# Patient Record
Sex: Male | Born: 1965 | Race: White | Hispanic: No | Marital: Married | Smoking: Never smoker
Health system: Southern US, Community
[De-identification: ages and names within clinical notes are randomized; demographics above are authoritative.]

## PROBLEM LIST (undated history)

## (undated) DIAGNOSIS — N201 Calculus of ureter: Secondary | ICD-10-CM

## (undated) DIAGNOSIS — I10 Essential (primary) hypertension: Secondary | ICD-10-CM

## (undated) DIAGNOSIS — M1990 Unspecified osteoarthritis, unspecified site: Secondary | ICD-10-CM

## (undated) DIAGNOSIS — Z8249 Family history of ischemic heart disease and other diseases of the circulatory system: Secondary | ICD-10-CM

## (undated) DIAGNOSIS — Z8739 Personal history of other diseases of the musculoskeletal system and connective tissue: Secondary | ICD-10-CM

## (undated) DIAGNOSIS — Z955 Presence of coronary angioplasty implant and graft: Secondary | ICD-10-CM

## (undated) DIAGNOSIS — I2510 Atherosclerotic heart disease of native coronary artery without angina pectoris: Secondary | ICD-10-CM

## (undated) DIAGNOSIS — Z9189 Other specified personal risk factors, not elsewhere classified: Secondary | ICD-10-CM

## (undated) HISTORY — PX: CYSTECTOMY: SUR359

## (undated) HISTORY — DX: Essential (primary) hypertension: I10

## (undated) HISTORY — PX: CARDIOVASCULAR STRESS TEST: SHX262

---

## 2005-08-29 ENCOUNTER — Emergency Department (HOSPITAL_COMMUNITY): Admission: EM | Admit: 2005-08-29 | Discharge: 2005-08-29 | Payer: Self-pay | Admitting: Emergency Medicine

## 2011-05-25 ENCOUNTER — Ambulatory Visit (INDEPENDENT_AMBULATORY_CARE_PROVIDER_SITE_OTHER): Payer: 59 | Admitting: Surgery

## 2011-05-25 ENCOUNTER — Encounter (INDEPENDENT_AMBULATORY_CARE_PROVIDER_SITE_OTHER): Payer: Self-pay | Admitting: Surgery

## 2011-05-25 VITALS — BP 152/86 | HR 64 | Temp 97.8°F | Ht 67.5 in | Wt 205.4 lb

## 2011-05-25 DIAGNOSIS — K4030 Unilateral inguinal hernia, with obstruction, without gangrene, not specified as recurrent: Secondary | ICD-10-CM

## 2011-05-25 NOTE — Progress Notes (Signed)
Subjective:     Patient ID: Gregory Nixon, male   DOB: 1966-05-08, 44 y.o.   MRN: [id number]  HPIMr. Nixon comes in today with his wife for examination of a right inguinal hernia. He was seen at [facility] by Dr. Alwyn Nixon who referred him. He denies having this for a long time on examination has a large scrotal hernia.  I described open repair with mesh and discussed the ramifications of repairing hernias with mesh. I also discussed the potential complications of hernia surgery not limited to nerve pain numbness recurrence bleeding. He wants to go ahead and proceed with open right inguinal hernia repair.   Review of Systems  Constitutional: Negative.   HENT: Negative.   Eyes: Negative.   Respiratory: Negative.   Cardiovascular: Negative.   Gastrointestinal: Negative.   Genitourinary: Negative.   Musculoskeletal:       History of gout  Skin: Negative.   Neurological: Negative.   Hematological: Negative.   Psychiatric/Behavioral: Negative.    Review of Systems  Constitutional: Negative.   HENT: Negative.   Eyes: Negative.   Respiratory: Negative.   Cardiovascular: Negative.   Gastrointestinal: Negative.   Genitourinary: Negative.   Musculoskeletal:       History of gout  Skin: Negative.   Neurological: Negative.   Endo/Heme/Allergies: Negative.   Psychiatric/Behavioral: Negative.   Gregory Nixon does not currently have medications on file. Family History  Problem Relation Age of Onset  . Heart disease Mother   . Heart disease Father   . Thyroid disease Father   . Heart disease Sister   . Thyroid disease Sister   . Diabetes Sister       Objective:   Physical Exam  Constitutional: He is oriented to person, place, and time. He appears well-developed and well-nourished.       Obese white male  HENT:  Head: Normocephalic and atraumatic.  Eyes: EOM are normal. Pupils are equal, round, and reactive to light.  Cardiovascular: Normal rate and regular rhythm.     Pulmonary/Chest: Effort normal and breath sounds normal.  Abdominal: Soft. Bowel sounds are normal.  Genitourinary:       Huge scrotal hernia on the right  Musculoskeletal: Normal range of motion.  Neurological: He is alert and oriented to person, place, and time.  Skin: Skin is dry.  Psychiatric: He has a normal mood and affect. His behavior is normal. Judgment and thought content normal.       Assessment:     ight scrotal hernia     Plan:     open right inguinal hernia repair with mesh

## 2011-05-29 ENCOUNTER — Encounter (HOSPITAL_BASED_OUTPATIENT_CLINIC_OR_DEPARTMENT_OTHER)
Admission: RE | Admit: 2011-05-29 | Discharge: 2011-05-29 | Disposition: A | Discharge status: Home / Self Care | Payer: 59 | Source: Ambulatory Visit | Attending: Surgery | Admitting: Surgery

## 2011-05-30 ENCOUNTER — Encounter (INDEPENDENT_AMBULATORY_CARE_PROVIDER_SITE_OTHER): Payer: Self-pay

## 2011-05-30 ENCOUNTER — Ambulatory Visit (HOSPITAL_BASED_OUTPATIENT_CLINIC_OR_DEPARTMENT_OTHER)
Admission: RE | Admit: 2011-05-30 | Discharge: 2011-05-30 | Disposition: A | Discharge status: Home / Self Care | Payer: 59 | Source: Ambulatory Visit | Attending: Surgery | Admitting: Surgery

## 2011-05-30 ENCOUNTER — Other Ambulatory Visit (INDEPENDENT_AMBULATORY_CARE_PROVIDER_SITE_OTHER): Payer: Self-pay

## 2011-05-30 DIAGNOSIS — K4030 Unilateral inguinal hernia, with obstruction, without gangrene, not specified as recurrent: Secondary | ICD-10-CM

## 2011-05-30 DIAGNOSIS — G8918 Other acute postprocedural pain: Secondary | ICD-10-CM

## 2011-05-30 DIAGNOSIS — E669 Obesity, unspecified: Secondary | ICD-10-CM | POA: Insufficient documentation

## 2011-05-30 DIAGNOSIS — Z01810 Encounter for preprocedural cardiovascular examination: Secondary | ICD-10-CM | POA: Insufficient documentation

## 2011-05-30 HISTORY — PX: OTHER SURGICAL HISTORY: SHX169

## 2011-05-30 LAB — POCT HEMOGLOBIN-HEMACUE: Hemoglobin: 16.8 g/dL (ref 13.0–17.0)

## 2011-05-30 MED ORDER — OXYCODONE-ACETAMINOPHEN 7.5-500 MG PO TABS: 1.0000 | ORAL_TABLET | Freq: Four times a day (QID) | ORAL | Status: DC | PRN

## 2011-05-31 NOTE — Op Note (Signed)
NAMECROSLEY, STEJSKAL NO.:  [id number]  MEDICAL RECORD NO.:  [id number]  LOCATION:                                 FACILITY:  PHYSICIAN:  Thornton Park. Daphine Deutscher, MD  DATE OF BIRTH:  December 04, 1965  DATE OF PROCEDURE:  05/30/2011 DATE OF DISCHARGE:                              OPERATIVE REPORT   PREOPERATIVE DIAGNOSIS:  Right scrotal hernia.  POSTOPERATIVE DIAGNOSIS:  Right incarcerated scrotal hernia.  PROCEDURE:  Repair right scrotal hernia with high ligation of a giant sac.  SURGEON:  Thornton Park. Daphine Deutscher, MD  ANESTHESIA:  General by LMA converted to a endotracheal.  DESCRIPTION OF PROCEDURE:  Mr. Matthews was seen and marked in the holding area.  Preoperatively, I had given him informed consent regarding repair of this large scrotal hernia.  He was taken back to room A where he was given general by LMA initially, later in the case was converted to endotracheal.  The inguinal region was clipped and then prepped widely with PC max and draped sterilely.  I felt the landmarks including an anterior superior iliac spine, but was unable to feel his pubis because of the size of this area.  The scrotum was completely filled with bowel as I presumed and his penis was pushed to the left and because of the size of this was barely visible.  We went able to palpate the pubis at first.  I made an oblique incision and ended up carrying it proximally and distally.  I mobilized this mass initially because I could not see its external oblique and internal external ring.  I mobilized this and brought it up out of the scrotum. His testicle came along with this and I subsequently separated this from the sac and then pexed it down into the scrotum.  I preserved the blood supply and then dissected this hernia sac out which was about the size of a large egg plant.  I opened it and within it contained multiple feet of small intestine which had been incarcerated there, although there  was no evidence of any extrangulation.  I reduced those up and incised the sac and once I had this up into the open ring, I went ahead and closed that with a pursestring suture of 2-0 silk.  I then took this sac remnant which in someway has been stuck to the cord structures and rather than amputating all this, which I thought would likely contribute to possible testicular infarction, I went ahead and reduced into the ring.  Because of the gross destruction of his anatomy by this chronic giant scrotal hernia, I felt that I was not going to be able to see my landmarks well enough to put in external mesh that would help.  I therefore used 2-0 Prolene to reconstruct the floor suturing Cooper's ligament to the transversalis fascia to snug up this ring and going from medial to lateral.  I also placed the suture laterally to make the ring a smaller.  I irrigated, injected here with Marcaine and closed in multiple layers with absorbable suture including a running 5-0 Monocryl, Benzoin Steri-Strips.  I explained to his wife.  I think it is significant  risk of recurrence, but for the near term I warned him to stay away for one more couple weeks. I am given him some Percocet for pain.  We will follow him up in the office.  FINAL DIAGNOSIS:  Right scrotal incarcerated hernia, status post repair.     Thornton Park Daphine Deutscher, MD     MBM/MEDQ  D:  05/30/2011  T:  05/30/2011  Job:  [id number]  cc:   Carmin Muskrat, MD  Electronically Signed by Luretha Murphy MD on 05/31/2011 07:26:56 AM

## 2011-06-06 ENCOUNTER — Ambulatory Visit (INDEPENDENT_AMBULATORY_CARE_PROVIDER_SITE_OTHER): Payer: Self-pay | Admitting: Surgery

## 2011-06-06 ENCOUNTER — Encounter (INDEPENDENT_AMBULATORY_CARE_PROVIDER_SITE_OTHER): Payer: Self-pay | Admitting: Surgery

## 2011-06-06 VITALS — Temp 95.3°F

## 2011-06-06 DIAGNOSIS — Z8719 Personal history of other diseases of the digestive system: Secondary | ICD-10-CM

## 2011-06-06 DIAGNOSIS — Z9889 Other specified postprocedural states: Secondary | ICD-10-CM

## 2011-06-06 NOTE — Patient Instructions (Addendum)
Wear jockey type support when up Elevate scrotum with pillow when sleeping.  Take pain meds as needed. Follow up with me on your previously made appointment

## 2011-06-06 NOTE — Progress Notes (Signed)
Worked in the office to examine his large scrotum.  He had a scrotal hernia and he has a seroma of this space.  Incision is OK.  Steri strips are in  Place.  No infection.  Minimal pain.   Will observe.  Keep followup appointment for 8/17.

## 2011-06-08 ENCOUNTER — Telehealth (INDEPENDENT_AMBULATORY_CARE_PROVIDER_SITE_OTHER): Payer: Self-pay | Admitting: Surgery

## 2011-06-15 ENCOUNTER — Ambulatory Visit (INDEPENDENT_AMBULATORY_CARE_PROVIDER_SITE_OTHER): Payer: Self-pay | Admitting: Surgery

## 2011-06-15 VITALS — Temp 98.3°F

## 2011-06-15 DIAGNOSIS — IMO0002 Reserved for concepts with insufficient information to code with codable children: Secondary | ICD-10-CM

## 2011-06-15 NOTE — Progress Notes (Signed)
Gregory Nixon and his wife came in today in followup. His right scrotal hernia repair his filled with seroma fluid. His pre-meshed days up all the time. He is not having pain. On exam it does not feel like a large recurrence but fluid. At the present time it may have a gelatinous component and hence would not be amenable to aspiration. He is getting along better. I advised him about wearing a scrotal support. I asked him to return to see me in 3 weeks.

## 2011-07-06 ENCOUNTER — Encounter (INDEPENDENT_AMBULATORY_CARE_PROVIDER_SITE_OTHER): Payer: Self-pay | Admitting: Surgery

## 2011-07-06 ENCOUNTER — Ambulatory Visit (INDEPENDENT_AMBULATORY_CARE_PROVIDER_SITE_OTHER): Payer: 59 | Admitting: Surgery

## 2011-07-06 VITALS — BP 148/92 | HR 60

## 2011-07-06 DIAGNOSIS — K4090 Unilateral inguinal hernia, without obstruction or gangrene, not specified as recurrent: Secondary | ICD-10-CM

## 2011-07-06 NOTE — Progress Notes (Signed)
Gregory Nixon and his wife came in today. When I last saw him 3 weeks ago the area were of his scrotal hernia was still filled with fluid. This is gradually regressing it is much smaller today. I cannot feel a definite recurrent hernia although he is at risk for wound cystoscopy was unable to repair this with mesh. I will see him again in 6 weeks. 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  Plan return 6 weeks

## 2011-08-15 ENCOUNTER — Encounter (INDEPENDENT_AMBULATORY_CARE_PROVIDER_SITE_OTHER): Payer: Self-pay | Admitting: Surgery

## 2011-08-15 ENCOUNTER — Ambulatory Visit (INDEPENDENT_AMBULATORY_CARE_PROVIDER_SITE_OTHER): Payer: 59 | Admitting: Surgery

## 2011-08-15 VITALS — BP 126/78 | HR 84 | Temp 98.7°F | Resp 12 | Ht 67.0 in | Wt 204.4 lb

## 2011-08-15 DIAGNOSIS — K4091 Unilateral inguinal hernia, without obstruction or gangrene, recurrent: Secondary | ICD-10-CM

## 2011-08-15 NOTE — Progress Notes (Signed)
Gregory and Gregory Nixon come in today in followup after his very difficult anterior riding or hernia repair done back in August. At that time he had a huge scrotal hernia with a massively dilated ring. Obesity and weak tissue may where I could not get good seating of mesh. I approximated his ring with Prolene but this is torn away in his hernia has recurred. It is big and  broad based. I explained this to him and in fact had warned him that this might occur.  I think going back I would recommend that an open preperitoneal approach. I think this is to be to tackle with the laparoscope. I would like to do in my partners to assist and will keep him overnight. I explained this to him and his wife.  Plan :  Schedule open preperitoneal hernia repair

## 2011-08-27 ENCOUNTER — Telehealth (INDEPENDENT_AMBULATORY_CARE_PROVIDER_SITE_OTHER): Payer: Self-pay | Admitting: General Surgery

## 2011-08-27 NOTE — Telephone Encounter (Signed)
Needs note for work stating how long he should be out for surgery. Note written and at front for patient pick up.

## 2011-08-29 ENCOUNTER — Encounter (HOSPITAL_COMMUNITY): Payer: 59

## 2011-08-29 ENCOUNTER — Encounter (HOSPITAL_COMMUNITY): Payer: Self-pay

## 2011-08-29 LAB — CBC
HCT: 47.5 % (ref 39.0–52.0)
Hemoglobin: 16.7 g/dL (ref 13.0–17.0)
WBC: 6.2 10*3/uL (ref 4.0–10.5)

## 2011-08-29 LAB — SURGICAL PCR SCREEN: Staphylococcus aureus: NEGATIVE

## 2011-08-29 NOTE — Patient Instructions (Signed)
20 Gregory Nixon  08/29/2011   Your procedure is scheduled on: 09/04/11   Tuesday  At 1200  [facility] at   [phone].  Call this number if you have problems the morning of surgery: [phone]   Remember:   Do not eat food:After Midnight. Monday night  Do not drink clear liquids: After Midnight.Monday night  Take these medicines the morning of surgery with A SIP OF WATER:  Hydrocodone with sip water if needed- bring bottle or dosage for verification day of surgery Regular soap face and privates-Hibiclins shower Mon PM and Tues AM   Do not wear jewelry, make-up or nail polish.  Do not wear lotions, powders, or perfumes. You may wear deodorant.  Do not shave 48 hours prior to surgery.  Do not bring valuables to the hospital.  Contacts, dentures or bridgework may not be worn into surgery.  Leave suitcase in the car. After surgery it may be brought to your room.  For patients admitted to the hospital, checkout time is 11:00 AM the day of discharge.   Patients discharged the day of surgery will not be allowed to drive home.  Name and phone number of your driver: [name]  wife  Special Instructions: CHG Shower Use Special Wash: 1/2 bottle night before surgery and 1/2 bottle morning of surgery.   Please read over the following fact sheets that you were given: Surgical Site Infection Prevention

## 2011-09-04 ENCOUNTER — Inpatient Hospital Stay (HOSPITAL_COMMUNITY)
Admission: AD | Admit: 2011-09-04 | Discharge: 2011-09-06 | DRG: 352 | Disposition: A | Discharge status: Home / Self Care | Payer: 59 | Source: Ambulatory Visit | Attending: Surgery | Admitting: Surgery

## 2011-09-04 ENCOUNTER — Encounter (HOSPITAL_COMMUNITY)
Admission: AD | Disposition: A | Discharge status: Home / Self Care | Payer: Self-pay | Source: Ambulatory Visit | Attending: Surgery

## 2011-09-04 ENCOUNTER — Encounter (HOSPITAL_COMMUNITY): Payer: Self-pay | Admitting: *Deleted

## 2011-09-04 ENCOUNTER — Encounter (HOSPITAL_COMMUNITY): Payer: Self-pay | Admitting: Anesthesiology

## 2011-09-04 ENCOUNTER — Ambulatory Visit (HOSPITAL_COMMUNITY): Payer: 59 | Admitting: Anesthesiology

## 2011-09-04 DIAGNOSIS — Z5331 Laparoscopic surgical procedure converted to open procedure: Secondary | ICD-10-CM

## 2011-09-04 DIAGNOSIS — Z8701 Personal history of pneumonia (recurrent): Secondary | ICD-10-CM

## 2011-09-04 DIAGNOSIS — M109 Gout, unspecified: Secondary | ICD-10-CM | POA: Diagnosis present

## 2011-09-04 DIAGNOSIS — Z7982 Long term (current) use of aspirin: Secondary | ICD-10-CM

## 2011-09-04 DIAGNOSIS — K4091 Unilateral inguinal hernia, without obstruction or gangrene, recurrent: Principal | ICD-10-CM | POA: Diagnosis present

## 2011-09-04 DIAGNOSIS — Z01812 Encounter for preprocedural laboratory examination: Secondary | ICD-10-CM

## 2011-09-04 DIAGNOSIS — K4090 Unilateral inguinal hernia, without obstruction or gangrene, not specified as recurrent: Secondary | ICD-10-CM

## 2011-09-04 HISTORY — PX: INGUINAL HERNIA REPAIR: SHX194

## 2011-09-04 SURGERY — REPAIR, HERNIA, INGUINAL, LAPAROSCOPIC
Anesthesia: General | Site: Abdomen | Laterality: Right | Wound class: Clean Contaminated

## 2011-09-04 MED ORDER — LACTATED RINGERS IV SOLN
INTRAVENOUS | Status: DC
  Administered 2011-09-04: 11:00:00 via INTRAVENOUS

## 2011-09-04 MED ORDER — HYDROMORPHONE HCL PF 1 MG/ML IJ SOLN: 1.0000 mg | INTRAMUSCULAR | Status: DC | PRN

## 2011-09-04 MED ORDER — ONDANSETRON HCL 4 MG/2ML IJ SOLN
4.0000 mg | Freq: Four times a day (QID) | INTRAMUSCULAR | Status: DC | PRN
Start: 2011-09-04 — End: 2011-09-06

## 2011-09-04 MED ORDER — GLYCOPYRROLATE 0.2 MG/ML IJ SOLN
INTRAMUSCULAR | Status: DC | PRN
  Administered 2011-09-04: .4 mg via INTRAVENOUS

## 2011-09-04 MED ORDER — LIDOCAINE HCL (CARDIAC) 20 MG/ML IV SOLN
INTRAVENOUS | Status: DC | PRN
  Administered 2011-09-04: 100 mg via INTRAVENOUS

## 2011-09-04 MED ORDER — ACETAMINOPHEN 10 MG/ML IV SOLN
INTRAVENOUS | Status: AC
  Filled 2011-09-04: qty 100

## 2011-09-04 MED ORDER — HYDROMORPHONE BOLUS VIA INFUSION
INTRAVENOUS | Status: DC | PRN
  Administered 2011-09-04: 1 mg via INTRAVENOUS

## 2011-09-04 MED ORDER — LACTATED RINGERS IV SOLN
INTRAVENOUS | Status: DC | PRN
  Administered 2011-09-04 (×3): via INTRAVENOUS

## 2011-09-04 MED ORDER — CEFAZOLIN SODIUM 1-5 GM-% IV SOLN
2.0000 g | INTRAVENOUS | Status: AC
  Administered 2011-09-04: 2 g via INTRAVENOUS

## 2011-09-04 MED ORDER — BUPIVACAINE-EPINEPHRINE 0.25% -1:200000 IJ SOLN
INTRAMUSCULAR | Status: AC
  Filled 2011-09-04: qty 1

## 2011-09-04 MED ORDER — DIPHENHYDRAMINE HCL 50 MG/ML IJ SOLN: 12.5000 mg | Freq: Four times a day (QID) | INTRAMUSCULAR | Status: DC | PRN

## 2011-09-04 MED ORDER — PROMETHAZINE HCL 25 MG/ML IJ SOLN: 6.2500 mg | INTRAMUSCULAR | Status: DC | PRN

## 2011-09-04 MED ORDER — PROPOFOL 10 MG/ML IV EMUL
INTRAVENOUS | Status: DC | PRN
  Administered 2011-09-04: 200 mg via INTRAVENOUS

## 2011-09-04 MED ORDER — ACETAMINOPHEN 10 MG/ML IV SOLN
INTRAVENOUS | Status: DC | PRN
  Administered 2011-09-04: 1000 mg via INTRAVENOUS

## 2011-09-04 MED ORDER — BUPIVACAINE LIPOSOME 1.3 % IJ SUSP
20.0000 mL | Freq: Once | INTRAMUSCULAR | Status: AC
  Administered 2011-09-04: 20 mL
  Filled 2011-09-04: qty 20

## 2011-09-04 MED ORDER — OXYCODONE HCL 5 MG PO TABS
5.0000 mg | ORAL_TABLET | ORAL | Status: DC | PRN
  Administered 2011-09-05 – 2011-09-06 (×5): 5 mg via ORAL
  Filled 2011-09-04 (×6): qty 1

## 2011-09-04 MED ORDER — LACTATED RINGERS IV SOLN
INTRAVENOUS | Status: DC
  Administered 2011-09-05 (×2): via INTRAVENOUS

## 2011-09-04 MED ORDER — HYDROMORPHONE HCL PF 1 MG/ML IJ SOLN: 0.2500 mg | INTRAMUSCULAR | Status: DC | PRN

## 2011-09-04 MED ORDER — PANTOPRAZOLE SODIUM 40 MG IV SOLR
40.0000 mg | Freq: Every day | INTRAVENOUS | Status: DC
  Administered 2011-09-04: 40 mg via INTRAVENOUS
  Filled 2011-09-04 (×2): qty 40

## 2011-09-04 MED ORDER — HYDROMORPHONE HCL PF 2 MG/ML IJ SOLN
1.0000 mg | INTRAMUSCULAR | Status: DC | PRN
  Administered 2011-09-04 – 2011-09-05 (×2): via INTRAVENOUS
  Filled 2011-09-04 (×2): qty 1

## 2011-09-04 MED ORDER — EPHEDRINE SULFATE 50 MG/ML IJ SOLN
INTRAMUSCULAR | Status: DC | PRN
  Administered 2011-09-04: 5 mg via INTRAVENOUS
  Administered 2011-09-04: 10 mg via INTRAVENOUS

## 2011-09-04 MED ORDER — FENTANYL CITRATE 0.05 MG/ML IJ SOLN
INTRAMUSCULAR | Status: DC | PRN
  Administered 2011-09-04: 50 ug via INTRAVENOUS
  Administered 2011-09-04 (×2): 100 ug via INTRAVENOUS
  Administered 2011-09-04 (×2): 25 ug via INTRAVENOUS

## 2011-09-04 MED ORDER — ACETAMINOPHEN 325 MG PO TABS: 650.0000 mg | ORAL_TABLET | Freq: Four times a day (QID) | ORAL | Status: DC | PRN

## 2011-09-04 MED ORDER — MIDAZOLAM HCL 5 MG/5ML IJ SOLN
INTRAMUSCULAR | Status: DC | PRN
  Administered 2011-09-04: 2 mg via INTRAVENOUS

## 2011-09-04 MED ORDER — SUCCINYLCHOLINE CHLORIDE 20 MG/ML IJ SOLN
INTRAMUSCULAR | Status: DC | PRN
  Administered 2011-09-04: 100 mg via INTRAVENOUS

## 2011-09-04 MED ORDER — HEPARIN SODIUM (PORCINE) 5000 UNIT/ML IJ SOLN
5000.0000 [IU] | Freq: Once | INTRAMUSCULAR | Status: AC
  Administered 2011-09-04: 5000 [IU] via SUBCUTANEOUS

## 2011-09-04 MED ORDER — ROCURONIUM BROMIDE 100 MG/10ML IV SOLN
INTRAVENOUS | Status: DC | PRN
  Administered 2011-09-04: 40 mg via INTRAVENOUS
  Administered 2011-09-04: 10 mg via INTRAVENOUS
  Administered 2011-09-04 (×3): 5 mg via INTRAVENOUS
  Administered 2011-09-04: 10 mg via INTRAVENOUS
  Administered 2011-09-04: 20 mg via INTRAVENOUS

## 2011-09-04 MED ORDER — NEOSTIGMINE METHYLSULFATE 1 MG/ML IJ SOLN
INTRAMUSCULAR | Status: DC | PRN
  Administered 2011-09-04: 3 mg via INTRAVENOUS

## 2011-09-04 MED ORDER — DIPHENHYDRAMINE HCL 12.5 MG/5ML PO ELIX: 12.5000 mg | ORAL_SOLUTION | Freq: Four times a day (QID) | ORAL | Status: DC | PRN

## 2011-09-04 MED ORDER — ACETAMINOPHEN 650 MG RE SUPP: 650.0000 mg | Freq: Four times a day (QID) | RECTAL | Status: DC | PRN

## 2011-09-04 MED ORDER — ONDANSETRON HCL 4 MG/2ML IJ SOLN
INTRAMUSCULAR | Status: DC | PRN
  Administered 2011-09-04 (×2): 2 mg via INTRAVENOUS

## 2011-09-04 MED ORDER — CEFAZOLIN SODIUM 1-5 GM-% IV SOLN
INTRAVENOUS | Status: AC
  Filled 2011-09-04: qty 100

## 2011-09-04 SURGICAL SUPPLY — 72 items
APL SKNCLS STERI-STRIP NONHPOA (GAUZE/BANDAGES/DRESSINGS) ×2
BALN DSCT LAPSCP LRG KNDY DSTN (BALLOONS)
BARD MESH ×1 IMPLANT
BENZOIN TINCTURE PRP APPL 2/3 (GAUZE/BANDAGES/DRESSINGS) ×5 IMPLANT
BLADE HEX COATED 2.75 (ELECTRODE) ×3 IMPLANT
BLADE SURG 15 STRL LF DISP TIS (BLADE) ×2 IMPLANT
BLADE SURG 15 STRL SS (BLADE)
BLADE SURG SZ10 CARB STEEL (BLADE) ×3 IMPLANT
CABLE HIGH FREQUENCY MONO STRZ (ELECTRODE) ×3 IMPLANT
CANISTER SUCTION 2500CC (MISCELLANEOUS) ×3 IMPLANT
CLOSURE STERI STRIP 1/2 X4 (GAUZE/BANDAGES/DRESSINGS) ×1 IMPLANT
CLOTH BEACON ORANGE TIMEOUT ST (SAFETY) ×5 IMPLANT
COVER SURGICAL LIGHT HANDLE (MISCELLANEOUS) ×3 IMPLANT
DECANTER SPIKE VIAL GLASS SM (MISCELLANEOUS) ×5 IMPLANT
DISSECT BALLN SPACEMKR + OVL (BALLOONS) ×3
DISSECT BALLN SPACEMKR OVL PDB (BALLOONS)
DISSECTOR BALLN SPACEMKR + OVL (BALLOONS) ×2 IMPLANT
DISSECTOR BALLN SPCMKR OVL PDB (BALLOONS) IMPLANT
DISSECTOR BLUNT TIP ENDO 5MM (MISCELLANEOUS) ×1 IMPLANT
DISSECTOR ROUND CHERRY 3/8 STR (MISCELLANEOUS) ×1 IMPLANT
DRAIN PENROSE 18X1/2 LTX STRL (DRAIN) ×2 IMPLANT
DRAPE LAPAROSCOPIC ABDOMINAL (DRAPES) ×3 IMPLANT
DRAPE LAPAROTOMY TRNSV 102X78 (DRAPE) ×2 IMPLANT
DRSG TEGADERM 2-3/8X2-3/4 SM (GAUZE/BANDAGES/DRESSINGS) IMPLANT
ELECT REM PT RETURN 9FT ADLT (ELECTROSURGICAL) ×3
ELECTRODE REM PT RTRN 9FT ADLT (ELECTROSURGICAL) ×4 IMPLANT
GAUZE SPONGE 4X4 16PLY XRAY LF (GAUZE/BANDAGES/DRESSINGS) IMPLANT
GLOVE BIOGEL M 8.0 STRL (GLOVE) ×6 IMPLANT
GLOVE BIOGEL PI IND STRL 7.0 (GLOVE) ×4 IMPLANT
GLOVE BIOGEL PI INDICATOR 7.0 (GLOVE) ×2
GOWN STRL NON-REIN LRG LVL3 (GOWN DISPOSABLE) ×6 IMPLANT
GOWN STRL REIN XL XLG (GOWN DISPOSABLE) ×11 IMPLANT
KIT BASIN OR (CUSTOM PROCEDURE TRAY) ×5 IMPLANT
MESH HERNIA 10X14 SHEET (Mesh General) ×1 IMPLANT
NDL HYPO 25X1 1.5 SAFETY (NEEDLE) ×2 IMPLANT
NDL INSUFFLATION 14GA 120MM (NEEDLE) IMPLANT
NEEDLE HYPO 25X1 1.5 SAFETY (NEEDLE) ×3 IMPLANT
NEEDLE INSUFFLATION 14GA 120MM (NEEDLE) ×3 IMPLANT
NS IRRIG 1000ML POUR BTL (IV SOLUTION) ×6 IMPLANT
PACK BASIC VI WITH GOWN DISP (CUSTOM PROCEDURE TRAY) ×2 IMPLANT
PEN SKIN MARKING BROAD (MISCELLANEOUS) ×3 IMPLANT
PENCIL BUTTON HOLSTER BLD 10FT (ELECTRODE) ×5 IMPLANT
SCISSORS LAP 5X35 DISP (ENDOMECHANICALS) IMPLANT
SET IRRIG TUBING LAPAROSCOPIC (IRRIGATION / IRRIGATOR) ×2 IMPLANT
SOLUTION ANTI FOG 6CC (MISCELLANEOUS) ×3 IMPLANT
SPONGE GAUZE 4X4 12PLY (GAUZE/BANDAGES/DRESSINGS) IMPLANT
SPONGE GAUZE 4X4 FOR O.R. (GAUZE/BANDAGES/DRESSINGS) ×1 IMPLANT
SPONGE LAP 4X18 X RAY DECT (DISPOSABLE) ×9 IMPLANT
STAPLER VISISTAT 35W (STAPLE) ×1 IMPLANT
STRIP CLOSURE SKIN 1/2X4 (GAUZE/BANDAGES/DRESSINGS) ×6 IMPLANT
SUT PROLENE 2 0 CT2 30 (SUTURE) ×8 IMPLANT
SUT SILK 2 0 SH (SUTURE) ×3 IMPLANT
SUT SILK 2 0 SH CR/8 (SUTURE) IMPLANT
SUT SURGILON 0 BLK (SUTURE) IMPLANT
SUT VIC AB 2-0 SH 27 (SUTURE) ×6
SUT VIC AB 2-0 SH 27X BRD (SUTURE) ×4 IMPLANT
SUT VIC AB 3-0 SH 27 (SUTURE)
SUT VIC AB 3-0 SH 27XBRD (SUTURE) IMPLANT
SUT VIC AB 4-0 SH 18 (SUTURE) ×6 IMPLANT
SYR 30ML LL (SYRINGE) ×3 IMPLANT
SYR BULB IRRIGATION 50ML (SYRINGE) ×2 IMPLANT
SYR CONTROL 10ML LL (SYRINGE) ×2 IMPLANT
TACKER 5MM HERNIA 3.5CML NAB (ENDOMECHANICALS) ×2 IMPLANT
TAPE CLOTH SURG 4X10 WHT LF (GAUZE/BANDAGES/DRESSINGS) ×1 IMPLANT
TOWEL OR 17X26 10 PK STRL BLUE (TOWEL DISPOSABLE) ×1 IMPLANT
TOWEL OR NON WOVEN STRL DISP B (DISPOSABLE) ×1 IMPLANT
TRAY FOLEY CATH 14FRSI W/METER (CATHETERS) ×3 IMPLANT
TRAY LAP CHOLE (CUSTOM PROCEDURE TRAY) ×3 IMPLANT
TROCAR BLADELESS OPT 5 75 (ENDOMECHANICALS) ×6 IMPLANT
TUBING FILTER THERMOFLATOR (ELECTROSURGICAL) ×2 IMPLANT
TUBING INSUFFLATION 10FT LAP (TUBING) ×3 IMPLANT
YANKAUER SUCT BULB TIP 10FT TU (MISCELLANEOUS) ×3 IMPLANT

## 2011-09-04 NOTE — Anesthesia Procedure Notes (Signed)
Procedures

## 2011-09-04 NOTE — Op Note (Signed)
Date 09/04/2011  Surgeon Luretha Murphy  Asst. Jaclynn Guarneri  Suture laparoscopic preperitoneal approach to recurrent right inguinal hernia, open right inguinal hernia repair with mesh  Anesthesia Gen. endotracheal  Description of procedure this 45 year old man was taken room 1 and [facility] and given general anesthesia. He was clipped and prepped with CMX and draped sterilely. Below the umbilicus a linear incision was made to accommodate the balloon dissector. A longitudinal incision was made in the fascia is identified and a transverse incision was made in the anterior rectus sheath. Blunt dissection was then passed down to the pubis with the camera in place the blunt dissection occurred it was good dissection. We then inserted 25 mm trochars and began dissecting the right cord structures. Had a gigantic hernia. One point I entered the hernia and a pneumoperitoneum. A put in a varies needle.  At some point felt that I was not making any further progress and that this sac was very densely adherent inside the hernia.  I then elected to make a right inguinal incision cutting down into his old incision the lateral part. Periods down to very densely adherent pseudo-sac and we identify the anatomy with some caution and got around the cord structures and this new hernia. I will mobilize the cord opened up the external oblique eventually was able to get the sac dissected free from the cord structures. I did a high ligation of the sac with a 2-0 silk and lipid return of the abdomen.  Then cut a piece of mesh having a fairly large and sutured along the inguinal ligament with a running 2-0 Prolene. I did this superiorly reached having getting part of the rectus sheath and then tightened this up beneath the external oblique laterally closing it down with 2 horizontal mattress sutures of 2-0 Prolene. The resulting closure of the internal ring, at the tip of my finger. Irrigated with saline.  Infiltrated with extraoral. I closed external oblique with running 2-0 Vicryl. 4:00 was used to the subcutaneous tissue. Wound was closed with staples.  In the meantime Dr. Johna Sheriff reinflated the preperitoneal space and we examined this from the inside. No hematoma was noted and a good repair was felt present from the preperitoneal location. Also to reinsert the varies needle to relieve the pneumoperitoneum. Patient was awakened and taken the recovery room in satisfactory condition.

## 2011-09-04 NOTE — H&P (Signed)
Chief Complaint:  Recurrent right inguinal hernia   History of Present Illness:  Gregory Nixon is an 45 y.o. male who underwent a right scrotal hernia repair earlier this year.  This has recurrred and he presents today for a preperitoneal repair.    Past Medical History  Diagnosis Date  . Hernia   . Gout   . Swelling     testicles  . High triglycerides   . Pneumonia     1990's x 2  . Neuromuscular disorder     gout bilateral lower extremeties  . No pertinent past medical history     elevated triglicerides/ EKG  9/12 on chart    Past Surgical History  Procedure Date  . Cystectomy 1973 or 1974    left leg  . Hernia repair 05/30/11    right inguinal hernia    Medications Prior to Admission  Medication Dose Route Frequency Provider Last Rate Last Dose  . ceFAZolin (ANCEF) IVPB 1 g/50 mL premix  2 g Intravenous 60 min Pre-Op Valarie Merino, MD      . heparin injection 5,000 Units  5,000 Units Subcutaneous Once Valarie Merino, MD   5,000 Units at 09/04/11 [phone]  . lactated ringers infusion   Intravenous Continuous Gaetano Hawthorne, MD 100 mL/hr at 09/04/11 1126     Medications Prior to Admission  Medication Sig Dispense Refill  . COLCRYS 0.6 MG tablet Take 0.6 mg by mouth daily as needed. FOR GOUT      . aspirin 325 MG tablet Take 325 mg by mouth every 6 (six) hours as needed. FOR PAIN         No Known Allergies   Family History  Problem Relation Age of Onset  . Heart disease Mother   . Heart disease Father   . Thyroid disease Father   . Heart disease Sister   . Thyroid disease Sister   . Diabetes Sister     Social History:   reports that he has never smoked. He has never used smokeless tobacco. His alcohol and drug histories not on file.   REVIEW OF SYSTEMS - PERTINENT POSITIVES ONLY: Negative except for right inguinal hernia  Physical Exam:   Blood pressure 140/96, pulse 86, temperature 98 F (36.7 C), resp. rate 20, SpO2 96.00%. There is no height or  weight on file to calculate BMI.  Gen:  No acute distress.  Well nourished and well groomed.   Neurological: Alert and oriented to person, place, and time. Coordination normal.  Head: Normocephalic and atraumatic.  Eyes: Conjunctivae are normal. Pupils are equal, round, and reactive to light. No scleral icterus.  Neck: Normal range of motion. Neck supple. No tracheal deviation or thyromegaly present.  Cardiovascular: Normal rate, regular rhythm, normal heart sounds and intact distal pulses.  Exam reveals no gallop and no friction rub.  No murmur heard. Respiratory: Effort normal.  No respiratory distress. No chest wall tenderness. Breath sounds normal.  No wheezes, rales or rhonchi.  GI: Soft. Bowel sounds are normal. The abdomen is soft and nontender.  There is no rebound and no guarding. There is a huge right scrotal hernia with a normal healed open incision Musculoskeletal: Normal range of motion. Extremities are nontender.  Lymphadenopathy: No cervical, preauricular, postauricular or axillary adenopathy is present Skin: Skin is warm and dry. No rash noted. No diaphoresis. No erythema. No pallor. No clubbing, cyanosis, or edema.   Psychiatric: Normal mood and affect. Behavior is normal. Judgment and thought  content normal.    LABORATORY RESULTS: No results found for this or any previous visit (from the past 48 hour(s)).  RADIOLOGY RESULTS: No results found.  Problem List: Active Problems:  * No active hospital problems. *    Assessment & Plan: Open/laparoscopic right inguinal hernia repair    Matt B. Daphine Deutscher, MD, [facility] [phone] beeper [phone]  09/04/2011 12:29 PM

## 2011-09-04 NOTE — Transfer of Care (Signed)
Immediate Anesthesia Transfer of Care Note  Patient: Gregory Nixon  Procedure(s) Performed:  LAPAROSCOPIC INGUINAL HERNIA; HERNIA REPAIR INGUINAL ADULT  Patient Location: PACU  Anesthesia Type: General  Level of Consciousness: alert   Airway & Oxygen Therapy: Patient Spontanous Breathing  Post-op Assessment: Report given to PACU RN  Post vital signs: Reviewed  Complications: No apparent anesthesia complications

## 2011-09-04 NOTE — Anesthesia Postprocedure Evaluation (Signed)
  Anesthesia Post-op Note  Patient: Gregory Nixon  Procedure(s) Performed:  LAPAROSCOPIC INGUINAL HERNIA; HERNIA REPAIR INGUINAL ADULT  Patient Location: PACU  Anesthesia Type: General  Level of Consciousness: awake and sedated  Airway and Oxygen Therapy: Patient Spontanous Breathing  Post-op Pain: mild  Post-op Assessment: Post-op Vital signs reviewed, Patient's Cardiovascular Status Stable, Respiratory Function Stable, Patent Airway, No signs of Nausea or vomiting and Pain level controlled  Post-op Vital Signs: Reviewed and stable  Complications: No apparent anesthesia complications

## 2011-09-04 NOTE — Anesthesia Preprocedure Evaluation (Addendum)
Anesthesia Evaluation  Patient identified by MRN, date of birth, ID band Patient awake    Reviewed: Allergy & Precautions, H&P , NPO status , Patient's Chart, lab work & pertinent test results  History of Anesthesia Complications Negative for: history of anesthetic complications  Airway Mallampati: II TM Distance: >3 FB Neck ROM: Full    Dental No notable dental hx. (+) Teeth Intact and Dental Advisory Given   Pulmonary neg pulmonary ROS,    Pulmonary exam normal       Cardiovascular neg cardio ROS     Neuro/Psych Negative Neurological ROS  Negative Psych ROS   GI/Hepatic negative GI ROS, Neg liver ROS,   Endo/Other  Negative Endocrine ROS  Renal/GU negative Renal ROS  Genitourinary negative   Musculoskeletal negative musculoskeletal ROS (+)   Abdominal Normal abdominal exam  (+)   Peds negative pediatric ROS (+)  Hematology negative hematology ROS (+)   Anesthesia Other Findings   Reproductive/Obstetrics                          Anesthesia Physical Anesthesia Plan  ASA: I  Anesthesia Plan: General   Post-op Pain Management:    Induction: Intravenous  Airway Management Planned: Oral ETT  Additional Equipment:   Intra-op Plan:   Post-operative Plan:   Informed Consent: I have reviewed the patients History and Physical, chart, labs and discussed the procedure including the risks, benefits and alternatives for the proposed anesthesia with the patient or authorized representative who has indicated his/her understanding and acceptance.   Dental advisory given  Plan Discussed with: CRNA and Surgeon  Anesthesia Plan Comments:         Anesthesia Quick Evaluation

## 2011-09-04 NOTE — Preoperative (Signed)
Beta Blockers   Reason not to administer Beta Blockers:Not Applicable 

## 2011-09-04 NOTE — Brief Op Note (Signed)
09/04/2011  4:11 PM  PATIENT:  Gregory Nixon  45 y.o. male  PRE-OPERATIVE DIAGNOSIS:  right recurrent inguinal hernia   POST-OPERATIVE DIAGNOSIS:  right recurrent inguinal hernia   PROCEDURE:  Procedure(s): LAPAROSCOPIC INGUINAL HERNIA HERNIA REPAIR INGUINAL ADULT  SURGEON:  Surgeon(s): Valarie Merino, MD Mariella Saa, MD  PHYSICIAN ASSISTANT:   ASSISTANTS: [place]    ANESTHESIA:   general  EBL:  Total I/O In: 3000 [I.V.:3000] Out: -   BLOOD ADMINISTERED:none  DRAINS: none   LOCAL MEDICATIONS USED:  MARCAINE 15CC  SPECIMEN:  No Specimen  DISPOSITION OF SPECIMEN:  N/A  COUNTS:  YES  TOURNIQUET:  * No tourniquets in log *  DICTATION: .Dragon Dictation  PLAN OF CARE: Admit for overnight observation  PATIENT DISPOSITION:  5 [address]   Delay start of Pharmacological VTE agent (>24hrs) due to surgical blood loss or risk of bleeding:  yes

## 2011-09-05 LAB — CBC
HCT: 42.9 % (ref 39.0–52.0)
Hemoglobin: 15 g/dL (ref 13.0–17.0)
RBC: 5.2 MIL/uL (ref 4.22–5.81)
RDW: 12.6 % (ref 11.5–15.5)
WBC: 8.2 10*3/uL (ref 4.0–10.5)

## 2011-09-05 MED ORDER — ZOLPIDEM TARTRATE 5 MG PO TABS: 5.0000 mg | ORAL_TABLET | Freq: Every evening | ORAL | Status: DC | PRN

## 2011-09-05 MED ORDER — PANTOPRAZOLE SODIUM 40 MG PO TBEC
40.0000 mg | DELAYED_RELEASE_TABLET | Freq: Every day | ORAL | Status: DC
  Administered 2011-09-05: 40 mg via ORAL
  Filled 2011-09-05 (×2): qty 1

## 2011-09-05 MED ORDER — COLCHICINE 0.6 MG PO TABS
0.6000 mg | ORAL_TABLET | Freq: Every day | ORAL | Status: DC | PRN
  Filled 2011-09-05: qty 1

## 2011-09-05 NOTE — Progress Notes (Signed)
  1 Day Post-Op  1 Day Post-Op  Subjective: Voiding frequently.  Last over 500 cc.  Hasn't been out of bed yet.  Pain controlled by Expariel.  Concerned about sending him home today with voiding issues, pain control issues knowing the extent of his laparoscopic and open redo right inguinal hernia repair  Objective: Vital signs in last 24 hours: Temp:  [97.7 F (36.5 C)-99.5 F (37.5 C)] 97.9 F (36.6 C) (11/07 0600) Pulse Rate:  [79-123] 103  (11/07 0644) Resp:  [11-20] 16  (11/07 0600) BP: (114-154)/(64-96) 154/91 mmHg (11/07 0600) SpO2:  [91 %-100 %] 91 % (11/07 0600) Weight:  [200 lb (90.719 kg)] 200 lb (90.719 kg) (11/06 1825)   Intake/Output from previous day: 11/06 0701 - 11/07 0700 In: 5992.5 [P.O.:600; I.V.:5392.5] Out: 985 [Urine:955; Blood:30] Intake/Output this shift:    Dressing dry.  No scrotal or penis ecchymosis.  Most soreness is around umbilicus  Lab Results:   Basename 09/05/11 0420  WBC 8.2  HGB 15.0  HCT 42.9  PLT 192   BMET No results found for this basename: NA:2,K:2,CL:2,CO2:2,GLUCOSE:2,BUN:2,CREATININE:2,CALCIUM:2 in the last 72 hours PT/INR No results found for this basename: LABPROT:2,INR:2 in the last 72 hours  Studies/Results: No results found.  Anti-infectives: Anti-infectives     Start     Dose/Rate Route Frequency Ordered Stop   09/04/11 0915   ceFAZolin (ANCEF) IVPB 1 g/50 mL premix        2 g 200 mL/hr over 30 Minutes Intravenous 60 min pre-op 09/04/11 0904 09/04/11 1246          Assessment/Plan: Didn't sleep.  Hasn't walked. Voiding issues remain.  Possible discharge tomorrow 1 Day Post-Op    LOS: 1 day    Matt B. Daphine Deutscher, MD, [facility] [phone] beeper [phone]  09/05/2011 7:46 AM

## 2011-09-05 NOTE — Progress Notes (Signed)
This patient is receiving IV Protonix. Based on criteria approved by the Pharmacy and Therapeutics Committee, this medication is being converted to the equivalent oral dose form. These criteria include: . The patient is eating (either orally or per tube) and/or has been taking other orally administered medications for at least 24 hours. . This patient has no evidence of active gastrointestinal bleeding or impaired GI absorption (gastrectomy, short bowel, patient on TNA or NPO).  If you have questions about this conversion, please contact the pharmacy department. Thank you.  

## 2011-09-06 MED ORDER — OXYCODONE HCL 5 MG PO TABS: 5.0000 mg | ORAL_TABLET | ORAL | Status: AC | PRN

## 2011-09-06 NOTE — Discharge Summary (Signed)
Physician Discharge Summary  Patient ID: Gregory Nixon MRN: [id number] DOB/AGE: [birth year] 45 y.o.  Admit date: 09/04/2011 Discharge date: 09/06/2011  Admission Diagnoses:  Discharge Diagnoses:  Active Problems:  * No active hospital problems. *    Discharged Condition: good  Hospital Course: Admitted after early recurrence of a scrotal hernia.  This required both a laparoscopic and open approach to reduce and repair with mesh.     Consults: none   Significant Diagnostic Studies: none  Treatments: surgery: laparoscopic and open repair recurrent right inguinal hernia with mesh  Discharge Exam: Blood pressure 124/80, pulse 93, temperature 98.7 F (37.1 C), temperature source Oral, resp. rate 16, height 5\' 7"  (1.702 m), weight 90.719 kg (200 lb), SpO2 96.00%. Incision/Wound:staples in place, wounds OK  Disposition: Home or Self Care  Discharge Orders    Future Orders Please Complete By Expires   Discharge patient        Current Discharge Medication List    START taking these medications   Details  oxyCODONE (OXY IR/ROXICODONE) 5 MG immediate release tablet Take 1 tablet (5 mg total) by mouth every 4 (four) hours as needed. Qty: 30 tablet, Refills: 0      CONTINUE these medications which have NOT CHANGED   Details  COLCRYS 0.6 MG tablet Take 0.6 mg by mouth daily as needed. FOR GOUT    aspirin 325 MG tablet Take 325 mg by mouth every 6 (six) hours as needed. FOR PAIN      STOP taking these medications     HYDROcodone-acetaminophen (NORCO) 5-325 MG per tablet          Signed: Ivalene Platte B 09/06/2011, 8:34 AM

## 2011-09-06 NOTE — Progress Notes (Signed)
  2 Days Post-Op  2 Days Post-Op  Subjective: Up walking better.  No complaints. 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  Anti-infectives: Anti-infectives     Start     Dose/Rate Route Frequency Ordered Stop   09/04/11 0915   ceFAZolin (ANCEF) IVPB 1 g/50 mL premix        2 g 200 mL/hr over 30 Minutes Intravenous 60 min pre-op 09/04/11 0904 09/04/11 1246          Assessment/Plan: Discharge 2 Days Post-Op    LOS: 2 days    Matt B. Daphine Deutscher, MD, [facility] [phone] beeper [phone]  09/06/2011 8:29 AM

## 2011-09-10 ENCOUNTER — Encounter (HOSPITAL_COMMUNITY): Payer: Self-pay | Admitting: Surgery

## 2011-09-11 ENCOUNTER — Telehealth (INDEPENDENT_AMBULATORY_CARE_PROVIDER_SITE_OTHER): Payer: Self-pay

## 2011-09-11 ENCOUNTER — Encounter (INDEPENDENT_AMBULATORY_CARE_PROVIDER_SITE_OTHER): Payer: 59

## 2011-09-11 NOTE — Telephone Encounter (Signed)
Pt into office for staple removal. Wounds clean,dry, cool to touch. Staples removed. Benzoin and steri strips applied. Pt to keep po appt with MD and call if any concerns.

## 2011-09-17 MED ORDER — ACETAMINOPHEN 10 MG/ML IV SOLN
INTRAVENOUS | Status: AC
  Filled 2011-09-17: qty 100

## 2011-09-17 MED ORDER — CEFAZOLIN SODIUM 1-5 GM-% IV SOLN
INTRAVENOUS | Status: AC
  Filled 2011-09-17: qty 50

## 2011-09-27 ENCOUNTER — Ambulatory Visit (INDEPENDENT_AMBULATORY_CARE_PROVIDER_SITE_OTHER): Payer: 59 | Admitting: Surgery

## 2011-09-27 ENCOUNTER — Encounter (INDEPENDENT_AMBULATORY_CARE_PROVIDER_SITE_OTHER): Payer: Self-pay | Admitting: General Surgery

## 2011-09-27 ENCOUNTER — Encounter (INDEPENDENT_AMBULATORY_CARE_PROVIDER_SITE_OTHER): Payer: Self-pay | Admitting: Surgery

## 2011-09-27 VITALS — BP 138/90 | HR 82 | Temp 97.8°F | Resp 20 | Ht 67.0 in | Wt 199.4 lb

## 2011-09-27 DIAGNOSIS — K4091 Unilateral inguinal hernia, without obstruction or gangrene, recurrent: Secondary | ICD-10-CM

## 2011-09-27 NOTE — Progress Notes (Signed)
Postop redo giant right hernia. Incision looks good. Still has a lot of swelling in his groin. No ecchymoses. Not complaining of numbness or pain. Will see back in 6 weeks. In the meantime he's indigo back to work on December 17.  Doing well after a very complicated and extensive right inguinal hernia redo.

## 2011-09-27 NOTE — Patient Instructions (Signed)
May return to work on Dec 17th Continue to avoid heavy lifting and straining until 6 weeks after surgery date

## 2011-10-24 ENCOUNTER — Ambulatory Visit (INDEPENDENT_AMBULATORY_CARE_PROVIDER_SITE_OTHER): Payer: 59

## 2011-10-24 DIAGNOSIS — M109 Gout, unspecified: Secondary | ICD-10-CM

## 2011-10-24 DIAGNOSIS — E782 Mixed hyperlipidemia: Secondary | ICD-10-CM

## 2011-10-28 ENCOUNTER — Other Ambulatory Visit (INDEPENDENT_AMBULATORY_CARE_PROVIDER_SITE_OTHER): Payer: 59

## 2011-10-28 DIAGNOSIS — M109 Gout, unspecified: Secondary | ICD-10-CM

## 2011-10-28 DIAGNOSIS — I10 Essential (primary) hypertension: Secondary | ICD-10-CM

## 2011-11-15 ENCOUNTER — Encounter (INDEPENDENT_AMBULATORY_CARE_PROVIDER_SITE_OTHER): Payer: Self-pay | Admitting: Surgery

## 2011-11-15 ENCOUNTER — Ambulatory Visit (INDEPENDENT_AMBULATORY_CARE_PROVIDER_SITE_OTHER): Payer: BC Managed Care – PPO | Admitting: Surgery

## 2011-11-15 VITALS — BP 124/78 | HR 74 | Temp 97.8°F | Resp 16 | Ht 67.0 in | Wt 200.0 lb

## 2011-11-15 DIAGNOSIS — Z8719 Personal history of other diseases of the digestive system: Secondary | ICD-10-CM

## 2011-11-15 DIAGNOSIS — Z9889 Other specified postprocedural states: Secondary | ICD-10-CM

## 2011-11-15 NOTE — Patient Instructions (Signed)
He may return to full activity. Always be careful to protect your back when lifting If you have any questions or develop any pains or concerns in this region don't hesitate to contact and see me as a long-term postop visit.

## 2011-11-15 NOTE — Progress Notes (Signed)
Gregory Nixon 46 y.o.  Body mass index is 31.32 kg/(m^2).  Patient Active Problem List  Diagnoses  . Postoperative seroma    No Known Allergies  Past Surgical History  Procedure Date  . Cystectomy 1973 or 1974    left leg  . Hernia repair 05/30/11    right inguinal hernia  . Inguinal hernia repair 09/04/2011    Procedure: LAPAROSCOPIC INGUINAL HERNIA;  Surgeon: Valarie Merino, MD;  Location: [facility];  Service: General;  Laterality: Right;  . Inguinal hernia repair 09/04/2011    Procedure: HERNIA REPAIR INGUINAL ADULT;  Surgeon: Valarie Merino, MD;  Location: [facility];  Service: General;  Laterality: N/A;   DAUB,STEVE A, MD, MD No diagnosis found.  Hernia repair intact. Patient experiencing no pain. Patient is over 2 months out from surgery and doing well. I told him he could return to life and work ad lib. Return p.r.n.Susy Frizzle B. Daphine Deutscher, MD, [facility] [phone] beeper [phone]  11/15/2011 9:23 AM

## 2012-04-29 ENCOUNTER — Other Ambulatory Visit: Payer: Self-pay | Admitting: Emergency Medicine

## 2012-04-29 NOTE — Telephone Encounter (Signed)
Needs office visit.

## 2012-06-03 ENCOUNTER — Other Ambulatory Visit: Payer: Self-pay | Admitting: Physician Assistant

## 2012-07-12 ENCOUNTER — Telehealth: Payer: Self-pay

## 2012-07-12 NOTE — Telephone Encounter (Signed)
Can we refill Rx? 

## 2012-07-12 NOTE — Telephone Encounter (Signed)
Patient called for rx refill. Patient says pharmacy has denied rx for blood pressure and does not explain why only that [facility] has to call pharmacy?. Please contact the patient as soon as you can at [phone]. Thank you!

## 2012-07-13 MED ORDER — LOSARTAN POTASSIUM 50 MG PO TABS: 50.0000 mg | ORAL_TABLET | Freq: Every day | ORAL | Status: DC

## 2012-07-13 MED ORDER — ATORVASTATIN CALCIUM 20 MG PO TABS: 20.0000 mg | ORAL_TABLET | Freq: Every day | ORAL | Status: DC

## 2012-07-13 NOTE — Telephone Encounter (Signed)
We can give her a partial month because he needs ov

## 2012-07-13 NOTE — Addendum Note (Signed)
Addended by: Morrell Riddle on: 07/13/2012 08:54 AM   Modules accepted: Orders

## 2012-07-13 NOTE — Addendum Note (Signed)
Addended by: Cydney Ok on: 07/13/2012 09:40 AM   Modules accepted: Orders

## 2012-07-13 NOTE — Telephone Encounter (Signed)
Spoke with pt advised he needs an ov. Pt understood. Losartan, atorvastain will be sent in as partial refill.

## 2012-07-28 ENCOUNTER — Ambulatory Visit (INDEPENDENT_AMBULATORY_CARE_PROVIDER_SITE_OTHER): Payer: BC Managed Care – PPO | Admitting: Family Medicine

## 2012-07-28 VITALS — BP 132/86 | HR 68 | Temp 98.1°F | Resp 16 | Ht 66.0 in | Wt 196.2 lb

## 2012-07-28 DIAGNOSIS — Z862 Personal history of diseases of the blood and blood-forming organs and certain disorders involving the immune mechanism: Secondary | ICD-10-CM

## 2012-07-28 DIAGNOSIS — E785 Hyperlipidemia, unspecified: Secondary | ICD-10-CM

## 2012-07-28 DIAGNOSIS — I10 Essential (primary) hypertension: Secondary | ICD-10-CM

## 2012-07-28 DIAGNOSIS — Z8739 Personal history of other diseases of the musculoskeletal system and connective tissue: Secondary | ICD-10-CM

## 2012-07-28 DIAGNOSIS — Z8639 Personal history of other endocrine, nutritional and metabolic disease: Secondary | ICD-10-CM

## 2012-07-28 LAB — POCT GLYCOSYLATED HEMOGLOBIN (HGB A1C): Hemoglobin A1C: 4.9

## 2012-07-28 MED ORDER — ATORVASTATIN CALCIUM 20 MG PO TABS: 20.0000 mg | ORAL_TABLET | Freq: Every day | ORAL | Status: DC

## 2012-07-28 MED ORDER — LOSARTAN POTASSIUM 50 MG PO TABS: 50.0000 mg | ORAL_TABLET | Freq: Every day | ORAL | Status: DC

## 2012-07-28 NOTE — Progress Notes (Signed)
  Subjective:    Patient ID: Gregory Nixon, male    DOB: 1966-01-31, 46 y.o.   MRN: [id number]  HPI    Review of Systems     Objective:   Physical Exam        Assessment & Plan:

## 2012-07-28 NOTE — Progress Notes (Signed)
S: Recheck for htn, high triglycerides.  Hx gout .  No flares for 10 months Ros negative  O: HEENT nl.   Chest clear.  Heart rrr.  Imp: htn Hyperlipidemia Hx gout  Plan Check labs and rtc 6 mo.

## 2012-07-28 NOTE — Patient Instructions (Signed)
Same meds

## 2012-07-29 ENCOUNTER — Encounter: Payer: Self-pay | Admitting: Family Medicine

## 2012-07-29 LAB — COMPREHENSIVE METABOLIC PANEL
AST: 23 U/L (ref 0–37)
Albumin: 5.1 g/dL (ref 3.5–5.2)
BUN: 13 mg/dL (ref 6–23)
CO2: 27 mEq/L (ref 19–32)
Calcium: 10 mg/dL (ref 8.4–10.5)
Chloride: 103 mEq/L (ref 96–112)
Glucose, Bld: 87 mg/dL (ref 70–99)
Potassium: 4.1 mEq/L (ref 3.5–5.3)

## 2012-07-29 LAB — LIPID PANEL
Cholesterol: 161 mg/dL (ref 0–200)
HDL: 29 mg/dL — ABNORMAL LOW (ref >39)

## 2012-11-21 ENCOUNTER — Telehealth: Payer: Self-pay | Admitting: Radiology

## 2012-11-21 NOTE — Telephone Encounter (Signed)
Records faxed to Dr Richardo Priest per his request/ he did not get most recent labs.

## 2013-02-27 ENCOUNTER — Other Ambulatory Visit: Payer: Self-pay | Admitting: Family Medicine

## 2013-02-28 ENCOUNTER — Other Ambulatory Visit: Payer: Self-pay | Admitting: Family Medicine

## 2013-04-04 ENCOUNTER — Other Ambulatory Visit: Payer: Self-pay | Admitting: Physician Assistant

## 2013-08-18 ENCOUNTER — Ambulatory Visit (INDEPENDENT_AMBULATORY_CARE_PROVIDER_SITE_OTHER): Payer: BC Managed Care – PPO | Admitting: Cardiovascular Disease

## 2013-08-18 ENCOUNTER — Encounter: Payer: Self-pay | Admitting: Cardiovascular Disease

## 2013-08-18 VITALS — BP 110/90 | HR 73 | Ht 67.0 in | Wt 199.2 lb

## 2013-08-18 DIAGNOSIS — R0609 Other forms of dyspnea: Secondary | ICD-10-CM | POA: Insufficient documentation

## 2013-08-18 DIAGNOSIS — R0989 Other specified symptoms and signs involving the circulatory and respiratory systems: Secondary | ICD-10-CM

## 2013-08-18 DIAGNOSIS — E559 Vitamin D deficiency, unspecified: Secondary | ICD-10-CM

## 2013-08-18 DIAGNOSIS — E785 Hyperlipidemia, unspecified: Secondary | ICD-10-CM | POA: Insufficient documentation

## 2013-08-18 DIAGNOSIS — Z01818 Encounter for other preprocedural examination: Secondary | ICD-10-CM

## 2013-08-18 DIAGNOSIS — I10 Essential (primary) hypertension: Secondary | ICD-10-CM

## 2013-08-18 DIAGNOSIS — R079 Chest pain, unspecified: Secondary | ICD-10-CM | POA: Insufficient documentation

## 2013-08-18 DIAGNOSIS — D689 Coagulation defect, unspecified: Secondary | ICD-10-CM

## 2013-08-18 MED ORDER — METOPROLOL TARTRATE 25 MG PO TABS: 25.0000 mg | ORAL_TABLET | Freq: Two times a day (BID) | ORAL | Status: DC

## 2013-08-18 NOTE — Patient Instructions (Signed)
Your physician has requested that you have a cardiac catheterization. Cardiac catheterization is used to diagnose and/or treat various heart conditions. Doctors may recommend this procedure for a number of different reasons. The most common reason is to evaluate chest pain. Chest pain can be a symptom of coronary artery disease (CAD), and cardiac catheterization can show whether plaque is narrowing or blocking your heart's arteries. This procedure is also used to evaluate the valves, as well as measure the blood flow and oxygen levels in different parts of your heart. For further information please visit [email or web address] Please follow instruction sheet, as given. This will be scheduled on Monday October 27th.  Your physician recommends that you return for lab work and chest xray on Friday October 24th.  Your physician has recommended you make the following change in your medication: start metoprolol tartrate. This has already been sent to the pharmacy. If continued chest pain you can increase the isosorbide to 60 mg as long as your blood pressure is normal.  Your physician recommends that you schedule a follow-up appointment in: instructions will be given at discharge.

## 2013-08-18 NOTE — Progress Notes (Signed)
Patient ID: Gregory Nixon, male   DOB: 02/09/1966, 47 y.o.   MRN: [id number]     PATIENT PROFILE: [name] is a 47-year-old male who is referred through the courtesy of Dr. Robert Ehinger for evaluation of recent development of exertionally precipitated chest tightness.   HPI:  [name] has a strong family history for coronary artery disease in both his mother who died at age 57 with a massive heart attack and his father who is status post CABG revascularization surgery. His sister who is 46 years old also has had multiple stents placed in her coronary arteries. The patient has a several year history of hypertension as well as hyperlipidemia for which she's been on medical therapy. For the past several weeks he has noticed a definite progressive development of exertion precipitated substernal chest tightness associated with left arm radiation and exertionally precipitated shortness of breath. These episodes typically have been relieved with stopping. They typically occur when he walks uphill or walks fast. They have occurred while at work. He denies any rest symptoms. He saw Dr. Ehinger and was prescribed isosorbide 30 mg. This has helped somewhat particularly with his left arm radiation but he still has noticed episodes of chest tightness. He presents now for cardiology evaluation. He admits to exertionally precipitated shortness of breath. He is unaware of tachycardia palpitations. He denies associated nausea or vomiting  Past Medical History  Diagnosis Date  . Hypertension   . Gout   . High triglycerides   . Pneumonia     1990's x 2  . Neuromuscular disorder     gout bilateral lower extremeties    Past Surgical History  Procedure Laterality Date  . Cystectomy  1973 or 1974    left leg  . Hernia repair  05/30/11    right inguinal hernia  . Inguinal hernia repair  09/04/2011    Procedure: LAPAROSCOPIC INGUINAL HERNIA;  Surgeon: Matthew B Martin, MD;  Location: [facility];  Service:  General;  Laterality: Right;  . Inguinal hernia repair  09/04/2011    Procedure: HERNIA REPAIR INGUINAL ADULT;  Surgeon: Matthew B Martin, MD;  Location: [facility];  Service: General;  Laterality: N/A;    No Known Allergies  Current Outpatient Prescriptions  Medication Sig Dispense Refill  . aspirin 81 MG tablet Take 81 mg by mouth daily.      . atorvastatin (LIPITOR) 20 MG tablet Take 1 tablet (20 mg total) by mouth daily. PATIENT NEEDS OFFICE VISIT FOR ADDITIONAL REFILLS  15 tablet  0  . fenofibrate 160 MG tablet Take 160 mg by mouth daily.      . isosorbide mononitrate (IMDUR) 30 MG 24 hr tablet Take 30 mg by mouth daily.      . losartan (COZAAR) 50 MG tablet Take 1 tablet (50 mg total) by mouth daily. PATIENT NEEDS OFFICE VISIT FOR ADDITIONAL REFILLS - 2nd NOTICE  15 tablet  0  . metoprolol tartrate (LOPRESSOR) 25 MG tablet Take 1 tablet (25 mg total) by mouth 2 (two) times daily.  60 tablet  6   No current facility-administered medications for this visit.    Social history is notable in that he is married for 8 years. He works as a printer Gregory Nixon. He completed 12th grade of education. He has one child age 21-2 does not routinely exercise but previously had been trying to walk intermittently. There is no tobacco history. He seldom drinks beer or wine.  Family History  Problem Relation Age   of Onset  . Heart disease Mother   . Heart disease Father   . Thyroid disease Father   . Heart disease Sister   . Thyroid disease Sister   . Diabetes Sister     ROS is negative for fever chills or night sweats. He denies skin changes. He denies visual changes. He does note shortness of breath with activity. He denies wheezing. He denies cough or sputum production. He denies presyncope or syncope. He does admit to chest pressure. He is unaware of palpitations. He denies nausea vomiting or diarrhea. He denies change in bowel or bladder habits. He denies hematuria or hematochezia. He denies  claudication symptoms. He denies paresthesias. He denies proctologist. He does have history of high triglycerides which have improved with the addition of fenofibrate to statin therapy. He has not have diabetes. Other comprehensive 12 point system review is negative.  PE BP 110/90  Pulse 73  Ht 5' 7" (1.702 m)  Wt 199 lb 3.2 oz (90.357 kg)  BMI 31.19 kg/m2 General: Alert, oriented, no distress.  Skin: normal turgor, no rashes HEENT: Normocephalic, atraumatic. Pupils round and reactive; sclera anicteric; Fundi no hemorrhages or exudates Nose without nasal septal hypertrophy Mouth/Parynx benign; Mallinpatti scale 3 Neck: No JVD, no carotid briuts Lungs: clear to ausculatation and percussion; no wheezing or rales No chest wall tenderness to palpation. Heart: RRR, s1 s2 normal faint 1/6 systolic murmur. Abdomen: soft, nontender; no hepatosplenomehaly, BS+; abdominal aorta nontender and not dilated by palpation. Pulses 2+ Extremities: no clubbinbg cyanosis or edema, Homan's sign negative  Neurologic: grossly nonfocal Psychologic: Normal affect and mood   ECG: Normal sinus rhythm at 73 beats per minute. QTc interval 425 ms. PR interval 156 ms.  LABS:  BMET    Component Value Date/Time   NA 140 07/28/2012 1828   K 4.1 07/28/2012 1828   CL 103 07/28/2012 1828   CO2 27 07/28/2012 1828   GLUCOSE 87 07/28/2012 1828   BUN 13 07/28/2012 1828   CREATININE 0.93 07/28/2012 1828   CALCIUM 10.0 07/28/2012 1828     Hepatic Function Panel     Component Value Date/Time   PROT 7.5 07/28/2012 1828   ALBUMIN 5.1 07/28/2012 1828   AST 23 07/28/2012 1828   ALT 32 07/28/2012 1828   ALKPHOS 73 07/28/2012 1828   BILITOT 0.6 07/28/2012 1828     CBC    Component Value Date/Time   WBC 8.2 09/05/2011 0420   RBC 5.20 09/05/2011 0420   HGB 15.0 09/05/2011 0420   HCT 42.9 09/05/2011 0420   PLT 192 09/05/2011 0420   MCV 82.5 09/05/2011 0420   MCH 28.8 09/05/2011 0420   MCHC 35.0 09/05/2011 0420   RDW 12.6  09/05/2011 0420     BNP No results found for this basename: probnp    Lipid Panel     Component Value Date/Time   CHOL 161 07/28/2012 1828   TRIG 483* 07/28/2012 1828   HDL 29* 07/28/2012 1828   CHOLHDL 5.6 07/28/2012 1828   VLDL NOT CALC 07/28/2012 1828   LDLCALC Comment:   Not calculated due to Triglyceride >400. Suggest ordering Direct LDL (Unit Code: 81033).   Total Cholesterol/HDL Ratio:CHD Risk                        Coronary Heart Disease Risk Table                                          Men       Women          1/2 Average Risk              3.4        3.3              Average Risk              5.0        4.4           2X Average Risk              9.6        7.1           3X Average Risk             23.4       11.0 Use the calculated Patient Ratio above and the CHD Risk table  to determine the patient's CHD Risk. ATP III Classification (LDL):       < 100        mg/dL         Optimal      100 - 129     mg/dL         Near or Above Optimal      130 - 159     mg/dL         Borderline High      160 - 189     mg/dL         High       > 190        mg/dL         Very High   07/28/2012 1828     RADIOLOGY: No results found.   ASSESSMENT AND PLAN: Mr. Barkley Kise is a 47-year-old gentleman with cardiac risk factors notable for hypertension, hyperlipidemia, and strong family history for premature coronary artery disease in both parents as well as in his sister. He now presents with a several week history of classic exertionally precipitated substernal chest tightness associated with left arm radiation and exertional dyspnea; class 3. His chest pain symptoms have improved slightly with the addition of isosorbide mononitrate but they have still occurred. They seem to occur with less activity. 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 Presently, I am adding beta blocker therapy with metoprolol tartrate  25 twice a day and if his chest pain continues he is to increase his isosorbide to 60 mg. It is my recommendation that definitive diagnostic cardiac catheterization be performed. I discussed with him the possibility of later this week wore on Monday next week and he prefers to be done on Monday. He is also on baby aspirin daily. Laboratory will be checked prior to catheterization study. I discussed risks benefits and detail and the potential options of medical therapy, percutaneous cardiac intervention, or need for CABG revascularization surgery depending upon his catheterization findings. He understands and consents to undergo this procedure.    Cristino A. Julienne Vogler, MD, FACC 08/18/2013 5:57 PM 

## 2013-08-19 ENCOUNTER — Encounter: Payer: Self-pay | Admitting: Cardiovascular Disease

## 2013-08-19 ENCOUNTER — Telehealth: Payer: Self-pay | Admitting: Cardiovascular Disease

## 2013-08-19 MED ORDER — NITROGLYCERIN 0.4 MG SL SUBL: SUBLINGUAL_TABLET | SUBLINGUAL | Status: DC

## 2013-08-19 NOTE — Telephone Encounter (Signed)
Patient understood he was to have nitroglycerine and a beta blocker called in yesterday to [facility].  Only the beta blocker has been called in.

## 2013-08-19 NOTE — Telephone Encounter (Signed)
Returned patient/wife's call - stated Dr. Tresa Endo was going to send in for SL nitroglycerin to be used as needed for CP. Informed this will be sent to pharmacy electronically.

## 2013-08-19 NOTE — Telephone Encounter (Signed)
Went to pick up his medicine from [facility],it was no prescription for nitroglcerin.

## 2013-08-20 ENCOUNTER — Ambulatory Visit
Admission: RE | Admit: 2013-08-20 | Discharge: 2013-08-20 | Disposition: A | Discharge status: Home / Self Care | Payer: BC Managed Care – PPO | Source: Ambulatory Visit | Attending: Cardiovascular Disease | Admitting: Cardiovascular Disease

## 2013-08-20 DIAGNOSIS — R079 Chest pain, unspecified: Secondary | ICD-10-CM

## 2013-08-20 LAB — CBC
Hemoglobin: 14.5 g/dL (ref 13.0–17.0)
MCHC: 35.6 g/dL (ref 30.0–36.0)
RDW: 13.4 % (ref 11.5–15.5)

## 2013-08-20 LAB — COMPREHENSIVE METABOLIC PANEL
ALT: 27 U/L (ref 0–53)
AST: 22 U/L (ref 0–37)
Albumin: 4.8 g/dL (ref 3.5–5.2)
BUN: 18 mg/dL (ref 6–23)
Calcium: 10 mg/dL (ref 8.4–10.5)
Chloride: 103 mEq/L (ref 96–112)
Potassium: 4.6 mEq/L (ref 3.5–5.3)

## 2013-08-20 LAB — LIPID PANEL
Cholesterol: 195 mg/dL (ref 0–200)
LDL Cholesterol: 134 mg/dL — ABNORMAL HIGH (ref 0–99)
Total CHOL/HDL Ratio: 6.1 Ratio
Triglycerides: 145 mg/dL (ref <150)
VLDL: 29 mg/dL (ref 0–40)

## 2013-08-20 LAB — PROTIME-INR: INR: 0.98 (ref <1.50)

## 2013-08-21 ENCOUNTER — Other Ambulatory Visit: Payer: Self-pay | Admitting: *Deleted

## 2013-08-21 ENCOUNTER — Encounter (HOSPITAL_COMMUNITY): Payer: Self-pay | Admitting: Pharmacy Technician

## 2013-08-21 ENCOUNTER — Telehealth: Payer: Self-pay | Admitting: *Deleted

## 2013-08-21 DIAGNOSIS — Z01810 Encounter for preprocedural cardiovascular examination: Secondary | ICD-10-CM

## 2013-08-21 NOTE — Telephone Encounter (Signed)
Entered heart catherization orders into epic.

## 2013-08-24 ENCOUNTER — Ambulatory Visit (HOSPITAL_COMMUNITY)
Admission: RE | Admit: 2013-08-24 | Discharge: 2013-08-25 | Disposition: A | Discharge status: Home / Self Care | Payer: BC Managed Care – PPO | Source: Ambulatory Visit | Attending: Cardiovascular Disease | Admitting: Cardiovascular Disease

## 2013-08-24 ENCOUNTER — Encounter (HOSPITAL_COMMUNITY): Payer: Self-pay | Admitting: General Practice

## 2013-08-24 ENCOUNTER — Encounter (HOSPITAL_COMMUNITY)
Admission: RE | Disposition: A | Discharge status: Home / Self Care | Payer: BC Managed Care – PPO | Source: Ambulatory Visit | Attending: Cardiovascular Disease

## 2013-08-24 DIAGNOSIS — R079 Chest pain, unspecified: Secondary | ICD-10-CM

## 2013-08-24 DIAGNOSIS — I200 Unstable angina: Secondary | ICD-10-CM | POA: Diagnosis present

## 2013-08-24 DIAGNOSIS — I10 Essential (primary) hypertension: Secondary | ICD-10-CM

## 2013-08-24 DIAGNOSIS — Z79899 Other long term (current) drug therapy: Secondary | ICD-10-CM | POA: Insufficient documentation

## 2013-08-24 DIAGNOSIS — Z955 Presence of coronary angioplasty implant and graft: Secondary | ICD-10-CM

## 2013-08-24 DIAGNOSIS — R0609 Other forms of dyspnea: Secondary | ICD-10-CM

## 2013-08-24 DIAGNOSIS — E785 Hyperlipidemia, unspecified: Secondary | ICD-10-CM | POA: Diagnosis present

## 2013-08-24 DIAGNOSIS — Z01810 Encounter for preprocedural cardiovascular examination: Secondary | ICD-10-CM

## 2013-08-24 DIAGNOSIS — I2510 Atherosclerotic heart disease of native coronary artery without angina pectoris: Secondary | ICD-10-CM

## 2013-08-24 DIAGNOSIS — Z8249 Family history of ischemic heart disease and other diseases of the circulatory system: Secondary | ICD-10-CM | POA: Insufficient documentation

## 2013-08-24 HISTORY — DX: Atherosclerotic heart disease of native coronary artery without angina pectoris: I25.10

## 2013-08-24 HISTORY — PX: LEFT AND RIGHT HEART CATHETERIZATION WITH CORONARY ANGIOGRAM: SHX5449

## 2013-08-24 HISTORY — PX: PERCUTANEOUS CORONARY STENT INTERVENTION (PCI-S): SHX5485

## 2013-08-24 HISTORY — DX: Unspecified osteoarthritis, unspecified site: M19.90

## 2013-08-24 LAB — POCT ACTIVATED CLOTTING TIME: Activated Clotting Time: 457 seconds

## 2013-08-24 SURGERY — LEFT AND RIGHT HEART CATHETERIZATION WITH CORONARY ANGIOGRAM
Anesthesia: LOCAL

## 2013-08-24 MED ORDER — HEPARIN (PORCINE) IN NACL 2-0.9 UNIT/ML-% IJ SOLN
INTRAMUSCULAR | Status: AC
  Filled 2013-08-24: qty 1000

## 2013-08-24 MED ORDER — MIDAZOLAM HCL 2 MG/2ML IJ SOLN
INTRAMUSCULAR | Status: AC
  Filled 2013-08-24: qty 2

## 2013-08-24 MED ORDER — NITROGLYCERIN 0.4 MG SL SUBL: 0.4000 mg | SUBLINGUAL_TABLET | SUBLINGUAL | Status: DC | PRN

## 2013-08-24 MED ORDER — ASPIRIN EC 81 MG PO TBEC
81.0000 mg | DELAYED_RELEASE_TABLET | Freq: Every day | ORAL | Status: DC
  Administered 2013-08-25: 81 mg via ORAL
  Filled 2013-08-24: qty 1

## 2013-08-24 MED ORDER — ATORVASTATIN CALCIUM 20 MG PO TABS
20.0000 mg | ORAL_TABLET | Freq: Every day | ORAL | Status: DC
  Filled 2013-08-24: qty 1

## 2013-08-24 MED ORDER — TICAGRELOR 90 MG PO TABS
ORAL_TABLET | ORAL | Status: AC
  Administered 2013-08-24: 90 mg via ORAL
  Filled 2013-08-24: qty 2

## 2013-08-24 MED ORDER — SODIUM CHLORIDE 0.9 % IV SOLN: 250.0000 mL | INTRAVENOUS | Status: DC | PRN

## 2013-08-24 MED ORDER — FENOFIBRATE 160 MG PO TABS
160.0000 mg | ORAL_TABLET | Freq: Every day | ORAL | Status: DC
  Administered 2013-08-24 – 2013-08-25 (×2): 160 mg via ORAL
  Filled 2013-08-24 (×3): qty 1

## 2013-08-24 MED ORDER — METOPROLOL TARTRATE 25 MG PO TABS: 25.0000 mg | ORAL_TABLET | Freq: Two times a day (BID) | ORAL | Status: DC

## 2013-08-24 MED ORDER — SODIUM CHLORIDE 0.9 % IJ SOLN: 3.0000 mL | INTRAMUSCULAR | Status: DC | PRN

## 2013-08-24 MED ORDER — NITROGLYCERIN IN D5W 200-5 MCG/ML-% IV SOLN
INTRAVENOUS | Status: AC
  Filled 2013-08-24: qty 250

## 2013-08-24 MED ORDER — BIVALIRUDIN 250 MG IV SOLR
INTRAVENOUS | Status: AC
  Filled 2013-08-24: qty 250

## 2013-08-24 MED ORDER — FENTANYL CITRATE 0.05 MG/ML IJ SOLN
INTRAMUSCULAR | Status: AC
  Filled 2013-08-24: qty 2

## 2013-08-24 MED ORDER — NITROGLYCERIN 0.2 MG/ML ON CALL CATH LAB
INTRAVENOUS | Status: AC
  Filled 2013-08-24: qty 1

## 2013-08-24 MED ORDER — LOSARTAN POTASSIUM 50 MG PO TABS
50.0000 mg | ORAL_TABLET | Freq: Every day | ORAL | Status: DC
  Administered 2013-08-24 – 2013-08-25 (×2): 50 mg via ORAL
  Filled 2013-08-24 (×2): qty 1

## 2013-08-24 MED ORDER — SODIUM CHLORIDE 0.9 % IV SOLN
0.2500 mg/kg/h | INTRAVENOUS | Status: DC
  Filled 2013-08-24 (×2): qty 250

## 2013-08-24 MED ORDER — DIAZEPAM 5 MG PO TABS
5.0000 mg | ORAL_TABLET | ORAL | Status: AC
  Administered 2013-08-24: 5 mg via ORAL
  Filled 2013-08-24: qty 1

## 2013-08-24 MED ORDER — ASPIRIN EC 81 MG PO TBEC: 81.0000 mg | DELAYED_RELEASE_TABLET | Freq: Every day | ORAL | Status: DC

## 2013-08-24 MED ORDER — HEPARIN SODIUM (PORCINE) 1000 UNIT/ML IJ SOLN
INTRAMUSCULAR | Status: AC
  Filled 2013-08-24: qty 1

## 2013-08-24 MED ORDER — ACETAMINOPHEN 325 MG PO TABS: 650.0000 mg | ORAL_TABLET | ORAL | Status: DC | PRN

## 2013-08-24 MED ORDER — SODIUM CHLORIDE 0.9 % IJ SOLN: 3.0000 mL | Freq: Two times a day (BID) | INTRAMUSCULAR | Status: DC

## 2013-08-24 MED ORDER — ONDANSETRON HCL 4 MG/2ML IJ SOLN: 4.0000 mg | Freq: Four times a day (QID) | INTRAMUSCULAR | Status: DC | PRN

## 2013-08-24 MED ORDER — TICAGRELOR 90 MG PO TABS
90.0000 mg | ORAL_TABLET | Freq: Two times a day (BID) | ORAL | Status: DC
  Administered 2013-08-24 – 2013-08-25 (×2): 90 mg via ORAL
  Filled 2013-08-24 (×3): qty 1

## 2013-08-24 MED ORDER — VERAPAMIL HCL 2.5 MG/ML IV SOLN
INTRAVENOUS | Status: AC
  Filled 2013-08-24: qty 2

## 2013-08-24 MED ORDER — METOPROLOL TARTRATE 25 MG PO TABS
25.0000 mg | ORAL_TABLET | Freq: Two times a day (BID) | ORAL | Status: DC
Start: 2013-08-24 — End: 2013-08-25
  Administered 2013-08-25: 25 mg via ORAL
  Filled 2013-08-24 (×2): qty 1

## 2013-08-24 MED ORDER — ISOSORBIDE MONONITRATE ER 30 MG PO TB24
30.0000 mg | ORAL_TABLET | Freq: Every day | ORAL | Status: DC
  Administered 2013-08-24 – 2013-08-25 (×2): 30 mg via ORAL
  Filled 2013-08-24 (×3): qty 1

## 2013-08-24 MED ORDER — SODIUM CHLORIDE 0.9 % IV SOLN: INTRAVENOUS | Status: DC

## 2013-08-24 MED ORDER — DEXTROSE-NACL 5-0.45 % IV SOLN
INTRAVENOUS | Status: DC
  Administered 2013-08-24: 08:00:00 via INTRAVENOUS

## 2013-08-24 MED ORDER — NITROGLYCERIN IN D5W 200-5 MCG/ML-% IV SOLN: 2.0000 ug/min | INTRAVENOUS | Status: DC

## 2013-08-24 MED ORDER — LIDOCAINE HCL (PF) 1 % IJ SOLN
INTRAMUSCULAR | Status: AC
  Filled 2013-08-24: qty 30

## 2013-08-24 NOTE — H&P (View-Only) (Signed)
Patient ID: Gregory Nixon, male   DOB: [birth year], 47 y.o.   MRN: [id number]     PATIENT PROFILE: [name] is a 47 year old male who is referred through the courtesy of Dr. Blair Heys for evaluation of recent development of exertionally precipitated chest tightness.   HPI:  Mr. [name] has a strong family history for coronary artery disease in both his mother who died at age 60 with a massive heart attack and his father who is status post CABG revascularization surgery. His sister who is 80 years old also has had multiple stents placed in her coronary arteries. The patient has a several year history of hypertension as well as hyperlipidemia for which she's been on medical therapy. For the past several weeks he has noticed a definite progressive development of exertion precipitated substernal chest tightness associated with left arm radiation and exertionally precipitated shortness of breath. These episodes typically have been relieved with stopping. They typically occur when he walks uphill or walks fast. They have occurred while at work. He denies any rest symptoms. He saw Dr. Manus Gunning and was prescribed isosorbide 30 mg. This has helped somewhat particularly with his left arm radiation but he still has noticed episodes of chest tightness. He presents now for cardiology evaluation. He admits to exertionally precipitated shortness of breath. He is unaware of tachycardia palpitations. He denies associated nausea or vomiting  Past Medical History  Diagnosis Date  . Hypertension   . Gout   . High triglycerides   . Pneumonia     1990's x 2  . Neuromuscular disorder     gout bilateral lower extremeties    Past Surgical History  Procedure Laterality Date  . Cystectomy  1973 or 1974    left leg  . Hernia repair  05/30/11    right inguinal hernia  . Inguinal hernia repair  09/04/2011    Procedure: LAPAROSCOPIC INGUINAL HERNIA;  Surgeon: Valarie Merino, MD;  Location: [facility];  Service:  General;  Laterality: Right;  . Inguinal hernia repair  09/04/2011    Procedure: HERNIA REPAIR INGUINAL ADULT;  Surgeon: Valarie Merino, MD;  Location: [facility];  Service: General;  Laterality: N/A;    No Known Allergies  Current Outpatient Prescriptions  Medication Sig Dispense Refill  . aspirin 81 MG tablet Take 81 mg by mouth daily.      Marland Kitchen atorvastatin (LIPITOR) 20 MG tablet Take 1 tablet (20 mg total) by mouth daily. PATIENT NEEDS OFFICE VISIT FOR ADDITIONAL REFILLS  15 tablet  0  . fenofibrate 160 MG tablet Take 160 mg by mouth daily.      . isosorbide mononitrate (IMDUR) 30 MG 24 hr tablet Take 30 mg by mouth daily.      Marland Kitchen losartan (COZAAR) 50 MG tablet Take 1 tablet (50 mg total) by mouth daily. PATIENT NEEDS OFFICE VISIT FOR ADDITIONAL REFILLS - 2nd NOTICE  15 tablet  0  . metoprolol tartrate (LOPRESSOR) 25 MG tablet Take 1 tablet (25 mg total) by mouth 2 (two) times daily.  60 tablet  6   No current facility-administered medications for this visit.    Social history is notable in that he is married for 8 years. He works as a Scientist, clinical (histocompatibility and immunogenetics). He completed 12th grade of education. He has one child age 56-2 does not routinely exercise but previously had been trying to walk intermittently. There is no tobacco history. He seldom drinks beer or wine.  Family History  Problem Relation Age  of Onset  . Heart disease Mother   . Heart disease Father   . Thyroid disease Father   . Heart disease Sister   . Thyroid disease Sister   . Diabetes Sister     ROS is negative for fever chills or night sweats. He denies skin changes. He denies visual changes. He does note shortness of breath with activity. He denies wheezing. He denies cough or sputum production. He denies presyncope or syncope. He does admit to chest pressure. He is unaware of palpitations. He denies nausea vomiting or diarrhea. He denies change in bowel or bladder habits. He denies hematuria or hematochezia. He denies  claudication symptoms. He denies paresthesias. He denies proctologist. He does have history of high triglycerides which have improved with the addition of fenofibrate to statin therapy. He has not have diabetes. Other comprehensive 12 point system review is negative.  PE BP 110/90  Pulse 73  Ht 5\' 7"  (1.702 m)  Wt 199 lb 3.2 oz (90.357 kg)  BMI 31.19 kg/m2 General: Alert, oriented, no distress.  Skin: normal turgor, no rashes HEENT: Normocephalic, atraumatic. Pupils round and reactive; sclera anicteric; Fundi no hemorrhages or exudates Nose without nasal septal hypertrophy Mouth/Parynx benign; Mallinpatti scale 3 Neck: No JVD, no carotid briuts Lungs: clear to ausculatation and percussion; no wheezing or rales No chest wall tenderness to palpation. Heart: RRR, s1 s2 normal faint 1/6 systolic murmur. Abdomen: soft, nontender; no hepatosplenomehaly, BS+; abdominal aorta nontender and not dilated by palpation. Pulses 2+ Extremities: no clubbinbg cyanosis or edema, Homan's sign negative  Neurologic: grossly nonfocal Psychologic: Normal affect and mood   ECG: Normal sinus rhythm at 73 beats per minute. QTc interval 425 ms. PR interval 156 ms.  LABS:  BMET    Component Value Date/Time   NA 140 07/28/2012 1828   K 4.1 07/28/2012 1828   CL 103 07/28/2012 1828   CO2 27 07/28/2012 1828   GLUCOSE 87 07/28/2012 1828   BUN 13 07/28/2012 1828   CREATININE 0.93 07/28/2012 1828   CALCIUM 10.0 07/28/2012 1828     Hepatic Function Panel     Component Value Date/Time   PROT 7.5 07/28/2012 1828   ALBUMIN 5.1 07/28/2012 1828   AST 23 07/28/2012 1828   ALT 32 07/28/2012 1828   ALKPHOS 73 07/28/2012 1828   BILITOT 0.6 07/28/2012 1828     CBC    Component Value Date/Time   WBC 8.2 09/05/2011 0420   RBC 5.20 09/05/2011 0420   HGB 15.0 09/05/2011 0420   HCT 42.9 09/05/2011 0420   PLT 192 09/05/2011 0420   MCV 82.5 09/05/2011 0420   MCH 28.8 09/05/2011 0420   MCHC 35.0 09/05/2011 0420   RDW 12.6  09/05/2011 0420     BNP No results found for this basename: probnp    Lipid Panel     Component Value Date/Time   CHOL 161 07/28/2012 1828   TRIG 483* 07/28/2012 1828   HDL 29* 07/28/2012 1828   CHOLHDL 5.6 07/28/2012 1828   VLDL NOT CALC 07/28/2012 1828   LDLCALC Comment:   Not calculated due to Triglyceride >400. Suggest ordering Direct LDL (Unit Code: 16109).   Total Cholesterol/HDL Ratio:CHD Risk                        Coronary Heart Disease Risk Table  Men       Women          1/2 Average Risk              3.4        3.3              Average Risk              5.0        4.4           2X Average Risk              9.6        7.1           3X Average Risk             23.4       11.0 Use the calculated Patient Ratio above and the CHD Risk table  to determine the patient's CHD Risk. ATP III Classification (LDL):       < 100        mg/dL         Optimal      562 - 129     mg/dL         Near or Above Optimal      130 - 159     mg/dL         Borderline High      160 - 189     mg/dL         High       > 130        mg/dL         Very High   8/65/7846 1828     RADIOLOGY: No results found.   ASSESSMENT AND PLAN: Mr. Jermal Dismuke is a 47 year old gentleman with cardiac risk factors notable for hypertension, hyperlipidemia, and strong family history for premature coronary artery disease in both parents as well as in his sister. He now presents with a several week history of classic exertionally precipitated substernal chest tightness associated with left arm radiation and exertional dyspnea; class 3. His chest pain symptoms have improved slightly with the addition of isosorbide mononitrate but they have still occurred. They seem to occur with less activity. 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 Presently, I am adding beta blocker therapy with metoprolol tartrate  25 twice a day and if his chest pain continues he is to increase his isosorbide to 60 mg. It is my recommendation that definitive diagnostic cardiac catheterization be performed. I discussed with him the possibility of later this week wore on Monday next week and he prefers to be done on Monday. He is also on baby aspirin daily. Laboratory will be checked prior to catheterization study. I discussed risks benefits and detail and the potential options of medical therapy, percutaneous cardiac intervention, or need for CABG revascularization surgery depending upon his catheterization findings. He understands and consents to undergo this procedure.    Lennette Bihari, MD, [facility] 08/18/2013 5:57 PM

## 2013-08-24 NOTE — CV Procedure (Signed)
Gregory Nixon is a 47 y.o. male    [id number]  [id number] LOCATION:  FACILITY: [facility]  PHYSICIAN: Lennette Bihari, MD, [facility] [birth year]   DATE OF PROCEDURE:  08/24/2013     CARDIAC CATHETERIZATION/2 VESSEL PCI TO LAD AND LCX    HISTORY:  [name] is a 47 year old gentleman who has a strong family history for coronary artery disease in both parents as well as his sister. The patient has a several year history of hypertension as well as hyperlipidemia. Over the past month he had developed episodes of progressive substernal exertionally precipitated chest pain. I saw him for cardiology evaluation in the office due  to symptoms worrisome for progressive angina pectoris.  He now presents for definitive diagnostic cardiac catheterization and possible percutaneous coronary intervention.   PROCEDURE:  The patient was brought to the second floor [facility] cath lab in the postabsorptive state. He was premedicated with Versed 2 mg and fentanyl 50 mcg intravenously initially. His right wrist was prepped and shaved in usual sterile fashion after an Freida Busman test confirmed good pulses. Xylocaine 1% was used for local anesthesia. The right radial catheterization was done with Dr. Tonny Bollman as my proctor. A 6 French Glidesheath slender SS was inserted into the right radial artery. 10 cc of a 3 mg intra-arterial verapamil  was administered via the sheath to reduce potential for arterial spasm. Diagnostic catheterizatiion was done with 5 [place] LF3.5, FR4, and pigtail catheters. A Verscacore wire was used for initial catheter insertion. A small J safety wire 260 mm was used for catheter exchanges. Left ventriculography was done with 25 cc Omnipaque contrast. With the demonstration of severe multivessel CAD with a total occlusion of the midright coronary artery with bridging collaterals and total distal occlusion as well as focal high-grade stenoses in the mid LAD and proximal circumflex after  discussing options with the patient the decision was made to attempt 2 vessel percutaneous coronary intervention. At this time, I broke scrub and reviewed the angiographic findings with the patient's family who agreed with the attempt at intervention. PCI was difficult secondary to attempting to cannulate the left main. Multiple catheters were used including a 6 [place] XB LAD 3.5 guide, JL 3.5 guide, JL 4.5 guide, and ultimately an AL-1 catheter was successful. The patient received Angiomax bolus plus infusion and was given 180 mg of oral Brilinta. ACT was therapeutic. Attention was initially directed down the LAD and ultimately a luge wire was advanced beyond the stenosis. Predilatation of the mid LAD was done with a 2.0x12 mm mini Trek balloon at 5,8, and9 atmospheres. A Promus Premier 2.75x16 mm DES stent was then inserted into the mid LAD with a stent placed just beyond the diagonal vessel. A Du[place] Trek 2.75x12 mm was dilated up to 11 atmosphere x2 for post stent dilatation. With the demonstration of an excellent angiographic result in the LAD territory, attention was then directed at the left circumflex coronary artery. The wire was maintained down the LAD to allow for stability of the guiding catheter wand a [place] wire was advanced down the circumflex vessel beyond the 80% eccentric proximal stenosis. The 2.75 x 12  balloon was inserted for catheter sizing with reference to length but not dilated.AXience Xpedition 3.5x15 mm DES stent was then inserted and dilated in the proximal circumflex at 10 and 12 atmospheres. Initially a 3.75x12 balloon [place] Trek was inserted but this was unable to make the bend into the circumflex without jeopardizing guide support. This  was then exchanged for 3.75x8 mm [place] Euphora balloon and dilations were made at 9,10, and 11 atmospheres. Scout angiography confirmed excellent angiographic result. There was brisk TIMI-3 flow without evidence for dissection at the intervention sites in  the LAD and circumflex vessel. Since 2 hours had transpired sine the Brilinta was administered  the Angiomax infusion was discontinued when the patient left the cath room. A TR band was applied 13 cm with excellent hemostasis. The patient tolerated the procedure well.  HEMODYNAMICS:   Central Aorta: 125/70   Left Ventricle: 125/15  ANGIOGRAPHY:  1. Left main: normal and bifurcated into an LAD and left circumflex coronary artery. 2. LAD: Moderate size vessel that extended to the LV apex. There was mild proximal diffuse narrowing of 30% before the diagonal vessel. Beyond the diagonal vessel and an LAD that was 99% eccentric focal stenosis. 3. Left circumflex: Large calibered dominant vessel that had eat percent smooth focal eccentric stenosis proximally before the takeoff of the first marginal branch. There also is 50% narrowing in the midportion of the marginal vessel before it trifurcated.  4. Right coronary artery: Diffusely diseased vessel with total occlusion at the mid segment with bridging antegrade collaterals extending to the PDA. The distal RCA was occluded after the PDA and a flush and fill phenomenal is seen due to collateralization to the distal RCA via left coronary circulation.  5. Left Ventriculography revealed normal ejection fraction at 55% without definitive wall motion abnormalities.   Following two-vessel coronary intervention involving the LAD with open insertion of a 2.75 x 12 Promus Premier DES stent the 99% stenosis was reduced to 0%. The stent was carefully placed just beyond the diagonal vessel. The left circumflex coronary artery 80% stenosis was reduced to 0% with open insertion of a 3.5 x 15 Xience Xpedition stent post dilated to 3.70 mm.   IMPRESSION:  Normal LV function with an ejection fraction at approximately 55%.  Three-vessel coronary disease with 30% narrowing of the proximal LAD before the first diagonal vessel, 99% focal eccentric stenosis of the mid  LAD; 80% proximal eccentric stenosis in the left circumflex coronary artery with 50% mid OM 1 stenosis; and diffuse 40-50% narrowing in the proximal RCA with total mid RCA occlusion with bridging collaterals supplying the distal RCA to PDA and with total occlusion of the distal RCA after the PDA takeoff with faint retrograde collaterals.  Successful 2 vessel coronary intervention involving the LAD with insertion of a 2.75x12 mm Promus Premier DES stent and a 99% stenosis reduced to 0%, and the left circumflex coronary artery 80% eccentric proximal stenosis reduced to 0% with insertion of a 3.5x15 mm Xience Xpedition DES stent postdilated 3.7 mm.    Lennette Bihari, MD, [facility] 08/24/2013 5:22 PM

## 2013-08-24 NOTE — Interval H&P Note (Signed)
Cath Lab Visit (complete for each Cath Lab visit)  Clinical Evaluation Leading to the Procedure:   ACS: no  Non-ACS:    Anginal Classification: CCS III  Anti-ischemic medical therapy: Maximal Therapy (2 or more classes of medications)  Non-Invasive Test Results: No non-invasive testing performed  Prior CABG: No previous CABG      History and Physical Interval Note:  08/24/2013 8:55 AM  Gregory Nixon  has presented today for surgery, with the diagnosis of cp  The various methods of treatment have been discussed with the patient and family. After consideration of risks, benefits and other options for treatment, the patient has consented to  Procedure(s): LEFT AND RIGHT HEART CATHETERIZATION WITH CORONARY ANGIOGRAM (N/A) as a surgical intervention .  The patient's history has been reviewed, patient examined, no change in status, stable for surgery.  I have reviewed the patient's chart and labs.  Questions were answered to the patient's satisfaction.     KELLY,Sina A

## 2013-08-25 DIAGNOSIS — Z955 Presence of coronary angioplasty implant and graft: Secondary | ICD-10-CM

## 2013-08-25 DIAGNOSIS — Z9861 Coronary angioplasty status: Secondary | ICD-10-CM

## 2013-08-25 DIAGNOSIS — I200 Unstable angina: Secondary | ICD-10-CM | POA: Diagnosis present

## 2013-08-25 DIAGNOSIS — E785 Hyperlipidemia, unspecified: Secondary | ICD-10-CM

## 2013-08-25 LAB — BASIC METABOLIC PANEL
Calcium: 8.5 mg/dL (ref 8.4–10.5)
Chloride: 106 mEq/L (ref 96–112)
GFR calc Af Amer: >90 mL/min (ref >90)
GFR calc non Af Amer: >90 mL/min (ref >90)
Glucose, Bld: 92 mg/dL (ref 70–99)
Potassium: 3.7 mEq/L (ref 3.5–5.1)
Sodium: 139 mEq/L (ref 135–145)

## 2013-08-25 LAB — CBC
Hemoglobin: 13.1 g/dL (ref 13.0–17.0)
MCH: 30.7 pg (ref 26.0–34.0)
MCHC: 36.2 g/dL — ABNORMAL HIGH (ref 30.0–36.0)
WBC: 5.9 10*3/uL (ref 4.0–10.5)

## 2013-08-25 MED ORDER — ATORVASTATIN CALCIUM 80 MG PO TABS: 80.0000 mg | ORAL_TABLET | Freq: Every day | ORAL | Status: DC

## 2013-08-25 MED ORDER — TICAGRELOR 90 MG PO TABS: 90.0000 mg | ORAL_TABLET | Freq: Two times a day (BID) | ORAL | Status: DC

## 2013-08-25 MED ORDER — ACTIVE PARTNERSHIP FOR HEALTH OF YOUR HEART BOOK
Freq: Once | Status: AC
  Administered 2013-08-25: 01:00:00
  Filled 2013-08-25: qty 1

## 2013-08-25 MED ORDER — ATORVASTATIN CALCIUM 80 MG PO TABS
80.0000 mg | ORAL_TABLET | Freq: Every day | ORAL | Status: DC
  Filled 2013-08-25: qty 1

## 2013-08-25 MED FILL — Sodium Chloride IV Soln 0.9%: INTRAVENOUS | Qty: 50 | Status: AC

## 2013-08-25 NOTE — Progress Notes (Signed)
Pt walked at quick pace with wife, about 400 ft. No sx. Sts he has been slightly SOB in room when it feels hot. Ed completed with wife present. Wife very receptive, pt quiet but voiced understanding. Somewhat interested in CRPII and will send referral to [facility]. Pt has decreased his triglycerides but still needs some diet work.  [id number] Ethelda Chick CES, ACSM 9:04 AM

## 2013-08-25 NOTE — Progress Notes (Signed)
TR BAND REMOVAL  LOCATION:    right radial  DEFLATED PER PROTOCOL:    yes  TIME BAND OFF / DRESSING APPLIED:    21:30   SITE UPON ARRIVAL:    Level 0  SITE AFTER BAND REMOVAL:    Level 0  REVERSE ALLEN'S TEST:     positive  CIRCULATION SENSATION AND MOVEMENT:    Within Normal Limits   yes  COMMENTS:

## 2013-08-25 NOTE — Discharge Summary (Signed)
Physician Discharge Summary  Patient ID: Gregory Nixon MRN: [id number] DOB/AGE: Mar 27, 1966 47 y.o.  Admit date: 08/24/2013 Discharge date: 08/25/2013  Admission Diagnoses:  Unstable angina  Discharge Diagnoses:  Principal Problem:   Unstable angina Active Problems:   Hyperlipidemia   S/P drug eluting coronary stent placement   Discharged Condition: stable  Hospital Course:  Gregory Nixon has a strong family history for coronary artery disease in both his mother who died at age 87 with a massive heart attack and his father who is status post CABG revascularization surgery. His sister who is 29 years old also has had multiple stents placed in her coronary arteries. The patient has a several year history of hypertension as well as hyperlipidemia for which she's been on medical therapy. For the past several weeks he has noticed a definite progressive development of exertion precipitated substernal chest tightness associated with left arm radiation and exertionally precipitated shortness of breath. These episodes typically have been relieved with stopping. They typically occur when he walks uphill or walks fast. They have occurred while at work. He denies any rest symptoms. He saw Dr. Manus Nixon and was prescribed isosorbide 30 mg. This has helped somewhat particularly with his left arm radiation but he still has noticed episodes of chest tightness. He presented for cardiology evaluation. He admits to exertionally precipitated shortness of breath. He is unaware of tachycardia palpitations. He denied associated nausea or vomiting.  The patient was scheduled for coronary angiogram which revealed three vessel disease with 99% focal eccentric stenosis of the mid LAD and 80% proximal eccentric stenosis in the left circumflex coronary artery(see full cath note below).  Successful 2 vessel coronary intervention involving the LAD with insertion of a 2.75x12 mm Promus Premier DES stent and a 99% stenosis reduced  to 0%, and the left circumflex coronary artery 80% eccentric proximal stenosis reduced to 0% with insertion of a 3.5x15 mm Xience Xpedition DES stent postdilated 3.7 mm. The patient was started on ASA and Brilinta which will be continued for a year.  Lipitor was increased to 80mg .  The patient was seen by Dr. Rennis Nixon who felt he was stable for DC home.   Consults: None  Significant Diagnostic Studies: HEMODYNAMICS:  Central Aorta: 125/70  Left Ventricle: 125/15  ANGIOGRAPHY:  1. Left main: normal and bifurcated into an LAD and left circumflex coronary artery.  2. LAD: Moderate size vessel that extended to the LV apex. There was mild proximal diffuse narrowing of 30% before the diagonal vessel. Beyond the diagonal vessel and an LAD that was 99% eccentric focal stenosis.  3. Left circumflex: Large calibered dominant vessel that had eat percent smooth focal eccentric stenosis proximally before the takeoff of the first marginal branch. There also is 50% narrowing in the midportion of the marginal vessel before it trifurcated.  4. Right coronary artery: Diffusely diseased vessel with total occlusion at the mid segment with bridging antegrade collaterals extending to the PDA. The distal RCA was occluded after the PDA and a flush and fill phenomenal is seen due to collateralization to the distal RCA via left coronary circulation.  5. Left Ventriculography revealed normal ejection fraction at 55% without definitive wall motion abnormalities.  Following two-vessel coronary intervention involving the LAD with open insertion of a 2.75 x 12 Promus Premier DES stent the 99% stenosis was reduced to 0%. The stent was carefully placed just beyond the diagonal vessel.  The left circumflex coronary artery 80% stenosis was reduced to 0% with open insertion of  a 3.5 x 15 Xience Xpedition stent post dilated to 3.70 mm.  IMPRESSION:  Normal LV function with an ejection fraction at approximately 55%.  Three-vessel  coronary disease with 30% narrowing of the proximal LAD before the first diagonal vessel, 99% focal eccentric stenosis of the mid LAD; 80% proximal eccentric stenosis in the left circumflex coronary artery with 50% mid OM 1 stenosis; and diffuse 40-50% narrowing in the proximal RCA with total mid RCA occlusion with bridging collaterals supplying the distal RCA to PDA and with total occlusion of the distal RCA after the PDA takeoff with faint retrograde collaterals.  Successful 2 vessel coronary intervention involving the LAD with insertion of a 2.75x12 mm Promus Premier DES stent and a 99% stenosis reduced to 0%, and the left circumflex coronary artery 80% eccentric proximal stenosis reduced to 0% with insertion of a 3.5x15 mm Xience Xpedition DES stent postdilated 3.7 mm.  Gregory Bihari, MD, [facility]  08/24/2013  Lipid Panel     Component Value Date/Time   CHOL 195 08/18/2013 1709   TRIG 145 08/18/2013 1709   HDL 32* 08/18/2013 1709   CHOLHDL 6.1 08/18/2013 1709   VLDL 29 08/18/2013 1709   LDLCALC 134* 08/18/2013 1709    Treatments: See above  Discharge Exam: Blood pressure 107/53, pulse 66, temperature 98 F (36.7 C), temperature source Oral, resp. rate 20, height 5\' 7"  (1.702 m), weight 201 lb 8 oz (91.4 kg), SpO2 98.00%.   Disposition: 01-Home or Self Care      Discharge Orders   Future Appointments Provider Department Dept Phone   09/01/2013 4:00 PM Gregory Bihari, MD [facility] [phone]   Future Orders Complete By Expires   Amb Referral to Cardiac Rehabilitation  As directed    Comments:     [place]   Diet - low sodium heart healthy  As directed    Discharge instructions  As directed    Comments:     No lifting more than a half gallon of milk or driving for three days.   Increase activity slowly  As directed        Medication List         aspirin EC 81 MG tablet  Take 81 mg by mouth daily.     atorvastatin 80 MG tablet  Commonly known as:   LIPITOR  Take 1 tablet (80 mg total) by mouth daily at 6 PM.     fenofibrate 160 MG tablet  Take 160 mg by mouth daily.     isosorbide mononitrate 30 MG 24 hr tablet  Commonly known as:  IMDUR  Take 30 mg by mouth daily.     losartan 50 MG tablet  Commonly known as:  COZAAR  Take 50 mg by mouth daily.     metoprolol tartrate 25 MG tablet  Commonly known as:  LOPRESSOR  Take 1 tablet (25 mg total) by mouth 2 (two) times daily.     nitroGLYCERIN 0.4 MG SL tablet  Commonly known as:  NITROSTAT  Place 0.4 mg under the tongue every 5 (five) minutes as needed for chest pain.     Ticagrelor 90 MG Tabs tablet  Commonly known as:  BRILINTA  Take 1 tablet (90 mg total) by mouth 2 (two) times daily.       Follow-up Information   Follow up with Gregory Bihari, MD On 09/01/2013. (4:00)    Specialty:  Cardiology   Contact information:   [address] [place]  [place] [address] [phone]       Signed: Wilburt Finlay 08/25/2013, 10:05 AM

## 2013-08-25 NOTE — Progress Notes (Signed)
DAILY PROGRESS NOTE  Subjective:  No events overnight. S/p successful PCI to the LAD and LCx with DES's yesterday via radial approach. He denies any chest pain. Has ambulated today without difficulty.  Objective:  Temp:  [97.7 F (36.5 C)-98.1 F (36.7 C)] 98 F (36.7 C) (10/28 0737) Pulse Rate:  [56-75] 66 (10/28 0737) Resp:  [17-20] 20 (10/28 0737) BP: (91-140)/(53-79) 107/53 mmHg (10/28 0737) SpO2:  [96 %-99 %] 98 % (10/28 0737) Weight:  [201 lb 8 oz (91.4 kg)] 201 lb 8 oz (91.4 kg) (10/28 0653) Weight change: 1 lb 8 oz (0.681 kg)  Intake/Output from previous day: 10/27 0701 - 10/28 0700 In: 1526.5 [P.O.:120; I.V.:1406.5] Out: 1250 [Urine:1250]  Intake/Output from this shift: Total I/O In: 240 [P.O.:240] Out: -   Medications: Current Facility-Administered Medications  Medication Dose Route Frequency Provider Last Rate Last Dose  . 0.9 %  sodium chloride infusion   Intravenous Continuous Lennette Bihari, MD 100 mL/hr at 08/25/13 0500    . acetaminophen (TYLENOL) tablet 650 mg  650 mg Oral Q4H PRN Lennette Bihari, MD      . aspirin EC tablet 81 mg  81 mg Oral Daily Lennette Bihari, MD   81 mg at 08/25/13 0903  . atorvastatin (LIPITOR) tablet 20 mg  20 mg Oral q1800 Lennette Bihari, MD      . bivalirudin [facility]) 5 mg/mL in sodium chloride 0.9 % 50 mL infusion  0.25 mg/kg/hr Intravenous Continuous Lennette Bihari, MD      . fenofibrate tablet 160 mg  160 mg Oral Daily Lennette Bihari, MD   160 mg at 08/25/13 0903  . isosorbide mononitrate (IMDUR) 24 hr tablet 30 mg  30 mg Oral Daily Lennette Bihari, MD   30 mg at 08/25/13 0903  . losartan (COZAAR) tablet 50 mg  50 mg Oral Daily Lennette Bihari, MD   50 mg at 08/25/13 0902  . metoprolol tartrate (LOPRESSOR) tablet 25 mg  25 mg Oral BID Laurey Morale, MD   25 mg at 08/25/13 0902  . nitroGLYCERIN (NITROSTAT) SL tablet 0.4 mg  0.4 mg Sublingual Q5 min PRN Lennette Bihari, MD      . nitroGLYCERIN 0.2 mg/mL in dextrose 5 %  infusion  2-200 mcg/min Intravenous Continuous Lennette Bihari, MD 3 mL/hr at 08/25/13 0500 10 mcg/min at 08/25/13 0500  . ondansetron (ZOFRAN) injection 4 mg  4 mg Intravenous Q6H PRN Lennette Bihari, MD      . Ticagrelor [facility]) tablet 90 mg  90 mg Oral BID Lennette Bihari, MD   90 mg at 08/25/13 0902    Physical Exam: General appearance: alert and no distress Neck: no carotid bruit and no JVD Lungs: clear to auscultation bilaterally Heart: regular rate and rhythm, S1, S2 normal, no murmur, click, rub or gallop Abdomen: soft, non-tender; bowel sounds normal; no masses,  no organomegaly Extremities: extremities normal, atraumatic, no cyanosis or edema Pulses: 2+ and symmetric Skin: Skin color, texture, turgor normal. No rashes or lesions Neurologic: Grossly normal Psych: Mood, affect normal  Lab Results: Results for orders placed during the hospital encounter of 08/24/13 (from the past 48 hour(s))  POCT ACTIVATED CLOTTING TIME     Status: None   Collection Time    08/24/13  2:43 PM      Result Value Range   Activated Clotting Time 457    CBC     Status: Abnormal   Collection  Time    08/25/13  5:45 AM      Result Value Range   WBC 5.9  4.0 - 10.5 K/uL   RBC 4.27  4.22 - 5.81 MIL/uL   Hemoglobin 13.1  13.0 - 17.0 g/dL   HCT 16.1 (*) 09.6 - 04.5 %   MCV 84.8  78.0 - 100.0 fL   MCH 30.7  26.0 - 34.0 pg   MCHC 36.2 (*) 30.0 - 36.0 g/dL   RDW 40.9  81.1 - 91.4 %   Platelets 204  150 - 400 K/uL  BASIC METABOLIC PANEL     Status: None   Collection Time    08/25/13  5:45 AM      Result Value Range   Sodium 139  135 - 145 mEq/L   Potassium 3.7  3.5 - 5.1 mEq/L   Chloride 106  96 - 112 mEq/L   CO2 23  19 - 32 mEq/L   Glucose, Bld 92  70 - 99 mg/dL   BUN 11  6 - 23 mg/dL   Creatinine, Ser 7.82  0.50 - 1.35 mg/dL   Calcium 8.5  8.4 - 95.6 mg/dL   GFR calc non Af Amer >90  >90 mL/min   GFR calc Af Amer >90  >90 mL/min   Comment: (NOTE)     The eGFR has been calculated using the  CKD EPI equation.     This calculation has not been validated in all clinical situations.     eGFR's persistently <90 mL/min signify possible Chronic Kidney     Disease.    Imaging: No results found.  Assessment:  Principal Problem:   Unstable angina Active Problems:   Hyperlipidemia   S/P drug eluting coronary stent placement   Plan:  1. He is doing well after DES placement in the LAD and LCx yesterday. Denies angina. Radial approach and cath site is without hematoma, good distal pulse. Admission fasting lipid profile still demonstrates elevated LDL-C of 134.  Will increase Lipitor from 20 mg to 80 mg daily (will need new Rx). Continue DAPT with ASA and Brillinta for 1 year. Ok for discharge home today. Follow-up with Dr. Tresa Endo or MLP in 5-7 days. Can likely return to work after follow-up. He is a good cardiac rehabilitation candidate.  Time Spent Directly with Patient:  15 minutes  Length of Stay:  LOS: 1 day   Chrystie Nose, MD, [facility] Attending Cardiologist [facility]  Licia Harl C 08/25/2013, 9:37 AM

## 2013-08-31 ENCOUNTER — Encounter: Payer: Self-pay | Admitting: *Deleted

## 2013-08-31 NOTE — Progress Notes (Signed)
Quick Note:  Note sent to patient ______ 

## 2013-09-01 ENCOUNTER — Ambulatory Visit (INDEPENDENT_AMBULATORY_CARE_PROVIDER_SITE_OTHER): Payer: BC Managed Care – PPO | Admitting: Cardiovascular Disease

## 2013-09-01 ENCOUNTER — Encounter: Payer: Self-pay | Admitting: Cardiovascular Disease

## 2013-09-01 VITALS — BP 122/70 | HR 62 | Ht 67.0 in | Wt 196.8 lb

## 2013-09-01 DIAGNOSIS — R079 Chest pain, unspecified: Secondary | ICD-10-CM

## 2013-09-01 DIAGNOSIS — I2510 Atherosclerotic heart disease of native coronary artery without angina pectoris: Secondary | ICD-10-CM

## 2013-09-01 DIAGNOSIS — Z955 Presence of coronary angioplasty implant and graft: Secondary | ICD-10-CM

## 2013-09-01 DIAGNOSIS — I10 Essential (primary) hypertension: Secondary | ICD-10-CM

## 2013-09-01 DIAGNOSIS — Z9861 Coronary angioplasty status: Secondary | ICD-10-CM

## 2013-09-01 DIAGNOSIS — E785 Hyperlipidemia, unspecified: Secondary | ICD-10-CM

## 2013-09-01 NOTE — Progress Notes (Signed)
Patient ID: Gregory Nixon, male   DOB: 16-Feb-1966, 47 y.o.   MRN: [id number]     HPI: [name] is a 47 year old male  who I saw 2 weeks ago through the courtesy of Dr. Blair Heys for evaluation of recent development of exertionally precipitated chest tightness. He presents to the office today following his recent cardiac catheterization and two-vessel coronary intervention.  Gregory Nixon has a strong family history for coronary artery disease in both his mother who died at age 24 with a massive heart attack and his father who is status post CABG revascularization surgery. His sister who is 23 years old also has had multiple stents placed in her coronary arteries. The patient has a several year history of hypertension as well as hyperlipidemia.  For the past several weeks he has noticed a definite progressive development of exertion precipitated substernal chest tightness associated with left arm radiation and exertionally precipitated shortness of breath. These episodes typically have been relieved with stopping. They typically occur when he walks uphill or walks fast. They have occurred while at work. He denies any rest symptoms. He saw Dr. Manus Gunning and was prescribed isosorbide 30 mg. I saw him for initial evaluation on 08/18/2013 and was concerned that his symptoms represented progressive accelerated angina pectoris. I added beta blocker and further titrated his isosorbide therapy. He underwent definitive cardiac catheterization on 08/24/2013 via the right radial artery approach. This revealed severe three-vessel coronary artery disease. His LAD had mild 30% narrowing before the diagonal vessel but after the diagonal vessel the LAD had a 99% eccentric focal stenosis. The proximal circumflex artery had an 80% smooth focal eccentric stenosis before the takeoff of the first marginal branch and there was a 50% stenosis in the midportion of the marginal vessel. The RCA was diffusely diseased and the  mid RCA was completely occluded. There were bridging collaterals extending to the PDA. The distal RCA was occluded after the PDA takeoff and he did have some distal collateralization via left coronary circulation. Ejection fraction on ventriculography was 55%. During that setting, I did perform successful two-vessel coronary intervention and stent to the 99% mid LAD stenosis with a 2.75x12 mm Promus premier DES stent and the left circumflex coronary artery stenosis with a 3.5x15 mm sites expedition DES stent. He was discharged the following day on increased medical regimen.  Since discharge, he does note significant improvement in his prior symptoms. He denies recurrent chest pain or exertional shortness of breath. He would like to return to work tomorrow if possible.  Past Medical History  Diagnosis Date  . Hypertension   . Gout   . High triglycerides   . Neuromuscular disorder     gout bilateral lower extremeties  . Coronary artery disease   . Pneumonia 1990's X2  . Exertional shortness of breath     "just recently" (08/24/2013)  . Arthritis     "little bit in my hands" (08/24/2013)    Past Surgical History  Procedure Laterality Date  . Cystectomy  1973 or 1974    left leg  . Inguinal hernia repair  09/04/2011    Procedure: LAPAROSCOPIC INGUINAL HERNIA;  Surgeon: Valarie Merino, MD;  Location: [facility];  Service: General;  Laterality: Right;  . Inguinal hernia repair  09/04/2011    Procedure: HERNIA REPAIR INGUINAL ADULT;  Surgeon: Valarie Merino, MD;  Location: [facility];  Service: General;  Laterality: N/A;  . Coronary angioplasty with stent placement  08/24/2013    "  2 stents" (08/24/2013)  . Inguinal hernia repair Right 05/30/11; 09/04/2011    No Known Allergies  Current Outpatient Prescriptions  Medication Sig Dispense Refill  . aspirin EC 81 MG tablet Take 81 mg by mouth daily.      Marland Kitchen atorvastatin (LIPITOR) 80 MG tablet Take 1 tablet (80 mg total) by mouth daily at 6 PM.  30  tablet  5  . fenofibrate 160 MG tablet Take 160 mg by mouth daily.      . isosorbide mononitrate (IMDUR) 30 MG 24 hr tablet Take 30 mg by mouth daily.      Marland Kitchen losartan (COZAAR) 50 MG tablet Take 50 mg by mouth daily.      . metoprolol tartrate (LOPRESSOR) 25 MG tablet Take 1 tablet (25 mg total) by mouth 2 (two) times daily.  60 tablet  6  . nitroGLYCERIN (NITROSTAT) 0.4 MG SL tablet Place 0.4 mg under the tongue every 5 (five) minutes as needed for chest pain.      . Ticagrelor (BRILINTA) 90 MG TABS tablet Take 1 tablet (90 mg total) by mouth 2 (two) times daily.  60 tablet  10   No current facility-administered medications for this visit.    Social history is notable in that he is married for 8 years. He works as a Scientist, clinical (histocompatibility and immunogenetics). He completed 12th grade of education. He has one child age 29-2 does not routinely exercise but previously had been trying to walk intermittently. There is no tobacco history. He seldom drinks beer or wine.  Family History  Problem Relation Age of Onset  . Heart disease Mother   . Heart disease Father   . Thyroid disease Father   . Heart disease Sister   . Thyroid disease Sister   . Diabetes Sister     ROS is negative for fever chills or night sweats. He denies skin changes. He denies visual changes. He does note shortness of breath with activity. He denies wheezing. He denies cough or sputum production. He denies presyncope or syncope. He denies any further episodes of chest pressure. He is unaware of palpitations. He denies nausea vomiting or diarrhea. He denies change in bowel or bladder habits. He denies hematuria or hematochezia. He denies claudication symptoms. He denies paresthesias. He denies proctologist. He does have history of high triglycerides which have improved with the addition of fenofibrate to statin therapy. He has not have diabetes. Other comprehensive 12 point system review is negative.  PE BP 122/70  Pulse 62  Ht 5\' 7"  (1.702 m)  Wt  196 lb 12.8 oz (89.268 kg)  BMI 30.82 kg/m2 General: Alert, oriented, no distress.  Skin: normal turgor, no rashes HEENT: Normocephalic, atraumatic. Pupils round and reactive; sclera anicteric; Fundi no hemorrhages or exudates Nose without nasal septal hypertrophy Mouth/Parynx benign; Mallinpatti scale 3 Neck: No JVD, no carotid briuts Lungs: clear to ausculatation and percussion; no wheezing or rales No chest wall tenderness to palpation. Heart: RRR, s1 s2 normal faint 1/6 systolic murmur. Abdomen: soft, nontender; no hepatosplenomehaly, BS+; abdominal aorta nontender and not dilated by palpation. Pulses 2+; right radial artery catheterization site is well-healed. Extremities: no clubbinbg cyanosis or edema, Homan's sign negative  Neurologic: grossly nonfocal Psychologic: Normal affect and mood   ECG: Normal sinus rhythm at 62 beats per minute. QTc interval 403 ms. PR interval 164 ms.  LABS:  BMET    Component Value Date/Time   NA 139 08/25/2013 0545   K 3.7 08/25/2013 0545   CL  106 08/25/2013 0545   CO2 23 08/25/2013 0545   GLUCOSE 92 08/25/2013 0545   BUN 11 08/25/2013 0545   CREATININE 0.88 08/25/2013 0545   CREATININE 0.99 08/18/2013 1709   CALCIUM 8.5 08/25/2013 0545   GFRNONAA >90 08/25/2013 0545   GFRAA >90 08/25/2013 0545     Hepatic Function Panel     Component Value Date/Time   PROT 7.0 08/18/2013 1709   ALBUMIN 4.8 08/18/2013 1709   AST 22 08/18/2013 1709   ALT 27 08/18/2013 1709   ALKPHOS 41 08/18/2013 1709   BILITOT 0.5 08/18/2013 1709     CBC    Component Value Date/Time   WBC 5.9 08/25/2013 0545   RBC 4.27 08/25/2013 0545   HGB 13.1 08/25/2013 0545   HCT 36.2* 08/25/2013 0545   PLT 204 08/25/2013 0545   MCV 84.8 08/25/2013 0545   MCH 30.7 08/25/2013 0545   MCHC 36.2* 08/25/2013 0545   RDW 12.3 08/25/2013 0545     BNP No results found for this basename: probnp    Lipid Panel     Component Value Date/Time   CHOL 195 08/18/2013  1709   TRIG 145 08/18/2013 1709   HDL 32* 08/18/2013 1709   CHOLHDL 6.1 08/18/2013 1709   VLDL 29 08/18/2013 1709   LDLCALC 134* 08/18/2013 1709     RADIOLOGY: No results found.   ASSESSMENT AND PLAN: [name] is a 47 year old gentleman with cardiac risk factors notable for hypertension, hyperlipidemia, and strong family history for premature coronary artery disease in both parents as well as in his sister. He had developed fairly classic exertionally precipitated substernal chest pain associated with exertional dyspnea. Cardiac catheterization performed one week ago revealed severe three-vessel disease with total occlusion of his mid RCA with both antegrade collaterals to the PDA system but also retrograde collaterals supplying the distal RCA beyond the PDA takeoff. He underwent two-vessel coronary intervention to a 99% mid LAD stenosis and a 80% proximal circumflex stenosis with drug-eluting stents. He now is on aspirin and Brilinta for dual antiplatelet therapy. He lipid abnormalities are now being aggressively treated with high-dose atorvastatin coupled with his fenofibrate. He did experience mild headaches with initiation of nitrates. Presently, we discussed aggressive lipid therapy to hopefully induce plaque regression in induce plaque stability. In several months we will check an NMR profile to assess optimal therapy of his mixed hyperlipidemia. I will see him in 3 months for followup autoregulation or sooner if problems arise.  Lennette Bihari, MD, [facility] 09/01/2013 6:53 PM

## 2013-09-01 NOTE — Patient Instructions (Signed)
Your physician recommends that you return for lab work in: 2 MONTHS fasting. Do not eat or drink after midnight the night prior to going to the lab. You will not need a appointment to have labs drawn.  Your physician recommends that you schedule a follow-up appointment in: 3 MONTHS

## 2014-03-26 ENCOUNTER — Other Ambulatory Visit: Payer: Self-pay | Admitting: Cardiovascular Disease

## 2014-03-26 NOTE — Telephone Encounter (Signed)
Rx was sent to pharmacy electronically. 

## 2014-04-01 ENCOUNTER — Encounter: Payer: Self-pay | Admitting: Cardiovascular Disease

## 2014-04-01 ENCOUNTER — Ambulatory Visit (INDEPENDENT_AMBULATORY_CARE_PROVIDER_SITE_OTHER): Payer: BC Managed Care – PPO | Admitting: Cardiovascular Disease

## 2014-04-01 VITALS — BP 124/72 | HR 49 | Ht 67.0 in | Wt 198.1 lb

## 2014-04-01 DIAGNOSIS — E782 Mixed hyperlipidemia: Secondary | ICD-10-CM

## 2014-04-01 DIAGNOSIS — R0609 Other forms of dyspnea: Secondary | ICD-10-CM

## 2014-04-01 DIAGNOSIS — Z9861 Coronary angioplasty status: Secondary | ICD-10-CM

## 2014-04-01 DIAGNOSIS — I10 Essential (primary) hypertension: Secondary | ICD-10-CM

## 2014-04-01 DIAGNOSIS — R0989 Other specified symptoms and signs involving the circulatory and respiratory systems: Secondary | ICD-10-CM

## 2014-04-01 DIAGNOSIS — Z955 Presence of coronary angioplasty implant and graft: Secondary | ICD-10-CM

## 2014-04-01 DIAGNOSIS — E785 Hyperlipidemia, unspecified: Secondary | ICD-10-CM

## 2014-04-01 MED ORDER — EZETIMIBE 10 MG PO TABS: 10.0000 mg | ORAL_TABLET | Freq: Every day | ORAL | Status: DC

## 2014-04-01 NOTE — Patient Instructions (Signed)
Your physician has recommended you make the following change in your medication: start new prescription for zetia. This has been sent to the pharmacy. Buy some fish oil over the counter. Take 2000 i.u. Daily  Your physician has requested that you have a lexiscan myoview. For further information please visit [email or web address] Please follow instruction sheet, as given.  This will be done in October with office visit to follow.

## 2014-04-05 ENCOUNTER — Encounter: Payer: Self-pay | Admitting: Cardiovascular Disease

## 2014-04-05 NOTE — Progress Notes (Signed)
Patient ID: Gregory Nixon, male   DOB: 07/23/1966, 48 y.o.   MRN: [id number]      HPI: Gregory Nixon is a 48 year old male  presents to the office for a six-month followup.  Neurologic evaluation.   Gregory Nixon has a strong family history for coronary artery disease in both his mother who died at age 48 with a massive heart attack and his father who is status post CABG revascularization surgery. His sister who is 48 years old also has had multiple stents placed in her coronary arteries. The patient has a several year history of hypertension as well as hyperlipidemia.  Last fall he developed progressive exertion precipitated substernal chest tightness associated with left arm radiation and exertionally precipitated shortness of breath.  He saw Dr. Manus GunningEhinger and was prescribed isosorbide 30 mg. I saw him for initial evaluation on 08/18/2013 and was concerned that his symptoms represented progressive accelerated angina pectoris. I added beta blocker and further titrated his isosorbide therapy. He underwent definitive cardiac catheterization on 08/24/2013 via the right radial artery approach. This revealed severe three-vessel coronary artery disease. His LAD had mild 30% narrowing before the diagonal vessel but after the diagonal vessel the LAD had a 99% eccentric focal stenosis. The proximal circumflex artery had an 80% smooth focal eccentric stenosis before the takeoff of the first marginal branch and there was a 50% stenosis in the midportion of the marginal vessel. The RCA was diffusely diseased and the mid RCA was completely occluded. There were bridging collaterals extending to the PDA. The distal RCA was occluded after the PDA takeoff and he did have some distal collateralization via left coronary circulation. Ejection fraction on ventriculography was 55%. During that setting, I did perform successful two-vessel coronary intervention and stent to the 99% mid LAD stenosis with a 2.75x12 mm Promus premier  DES stent and the left circumflex coronary artery stenosis with a 3.5x15 mm sites expedition DES stent. He was discharged the following day on increased medical regimen.  I saw in followup several weeks later and he  noted significant improvement in his prior symptoms.  Over the past 6 months, he has continued to be stable.  He denies recurrent anginal symptoms.  At times there is some mild shortness of breath.  He is unaware of any palpitations.  He will be going on vacation for the next month.  Past Medical History  Diagnosis Date  . Hypertension   . Gout   . High triglycerides   . Neuromuscular disorder     gout bilateral lower extremeties  . Coronary artery disease   . Pneumonia 1990's X2  . Exertional shortness of breath     "just recently" (08/24/2013)  . Arthritis     "little bit in my hands" (08/24/2013)    Past Surgical History  Procedure Laterality Date  . Cystectomy  1973 or 1974    left leg  . Inguinal hernia repair  09/04/2011    Procedure: LAPAROSCOPIC INGUINAL HERNIA;  Surgeon: Valarie MerinoMatthew B Martin, MD;  Location: [facility];  Service: General;  Laterality: Right;  . Inguinal hernia repair  09/04/2011    Procedure: HERNIA REPAIR INGUINAL ADULT;  Surgeon: Valarie MerinoMatthew B Martin, MD;  Location: [facility];  Service: General;  Laterality: N/A;  . Coronary angioplasty with stent placement  08/24/2013    "2 stents" (08/24/2013)  . Inguinal hernia repair Right 05/30/11; 09/04/2011    No Known Allergies  Current Outpatient Prescriptions  Medication Sig Dispense Refill  . aspirin  EC 81 MG tablet Take 81 mg by mouth daily.      Marland Kitchen atorvastatin (LIPITOR) 80 MG tablet Take 1 tablet (80 mg total) by mouth daily at 6 PM.  30 tablet  5  . ezetimibe (ZETIA) 10 MG tablet Take 1 tablet (10 mg total) by mouth daily.  30 tablet  6  . fenofibrate 160 MG tablet Take 160 mg by mouth daily.      . isosorbide mononitrate (IMDUR) 30 MG 24 hr tablet Take 30 mg by mouth daily.      Marland Kitchen losartan (COZAAR) 50 MG  tablet Take 50 mg by mouth daily.      . metoprolol tartrate (LOPRESSOR) 25 MG tablet TAKE 1 TABLET BY MOUTH 2 TIMES DAILY.  60 tablet  6  . nitroGLYCERIN (NITROSTAT) 0.4 MG SL tablet Place 0.4 mg under the tongue every 5 (five) minutes as needed for chest pain.      . Ticagrelor (BRILINTA) 90 MG TABS tablet Take 1 tablet (90 mg total) by mouth 2 (two) times daily.  60 tablet  10   No current facility-administered medications for this visit.    Social history is notable in that he is married for 8 years. He works as a Scientist, clinical (histocompatibility and immunogenetics). He completed 12th grade of education. He has one child age 59-2 does not routinely exercise but previously had been trying to walk intermittently. There is no tobacco history. He seldom drinks beer or wine.  Family History  Problem Relation Age of Onset  . Heart disease Mother   . Heart disease Father   . Thyroid disease Father   . Heart disease Sister   . Thyroid disease Sister   . Diabetes Sister    ROS General: Negative; No fevers, chills, or night sweats;  HEENT: Negative; No changes in vision or hearing, sinus congestion, difficulty swallowing Pulmonary: Negative; No cough, wheezing, shortness of breath, hemoptysis Cardiovascular: See history of present illness No chest pain, presyncope, syncope, palpatations GI: Negative; No nausea, vomiting, diarrhea, or abdominal pain GU: Negative; No dysuria, hematuria, or difficulty voiding Musculoskeletal: Negative; no myalgias, joint pain, or weakness Hematologic/Oncology: Negative; no easy bruising, bleeding Endocrine: Negative; no heat/cold intolerance; no diabetes Neuro: Negative; no changes in balance, headaches Skin: Negative; No rashes or skin lesions Psychiatric: Negative; No behavioral problems, depression Sleep: Negative; No snoring, daytime sleepiness, hypersomnolence, bruxism, restless legs, hypnogognic hallucinations, no cataplexy Other comprehensive 14 point system review is  negative.   PE BP 124/72  Pulse 49  Ht 5\' 7"  (1.702 m)  Wt 198 lb 1.6 oz (89.858 kg)  BMI 31.02 kg/m2 General: Alert, oriented, no distress.  Skin: normal turgor, no rashes HEENT: Normocephalic, atraumatic. Pupils round and reactive; sclera anicteric; Fundi no hemorrhages or exudates Nose without nasal septal hypertrophy Mouth/Parynx benign; Mallinpatti scale 3 Neck: No JVD, no carotid briuts Lungs: clear to ausculatation and percussion; no wheezing or rales No chest wall tenderness to palpation. Heart: RRR, s1 s2 normal faint 1/6 systolic murmur, no diastolic murmur.  No rubs, thrills or heaves.. Abdomen: Mild central adiposity, soft, nontender; no hepatosplenomehaly, BS+; abdominal aorta nontender and not dilated by palpation. Pulses 2+; right radial artery catheterization site is well-healed. Extremities: no clubbinbg cyanosis or edema, Homan's sign negative  Neurologic: grossly nonfocal Psychologic: Normal affect and mood  ECG (independently read by me): Sinus bradycardia 49 beats per minute.  No ectopy.  Normal intervals.  Mild T-wave change in lead 1 and aVL.  ECG: Normal sinus rhythm at  62 beats per minute. QTc interval 403 ms. PR interval 164 ms.  LABS:  BMET    Component Value Date/Time   NA 139 08/25/2013 0545   K 3.7 08/25/2013 0545   CL 106 08/25/2013 0545   CO2 23 08/25/2013 0545   GLUCOSE 92 08/25/2013 0545   BUN 11 08/25/2013 0545   CREATININE 0.88 08/25/2013 0545   CREATININE 0.99 08/18/2013 1709   CALCIUM 8.5 08/25/2013 0545   GFRNONAA >90 08/25/2013 0545   GFRAA >90 08/25/2013 0545     Hepatic Function Panel     Component Value Date/Time   PROT 7.0 08/18/2013 1709   ALBUMIN 4.8 08/18/2013 1709   AST 22 08/18/2013 1709   ALT 27 08/18/2013 1709   ALKPHOS 41 08/18/2013 1709   BILITOT 0.5 08/18/2013 1709     CBC    Component Value Date/Time   WBC 5.9 08/25/2013 0545   RBC 4.27 08/25/2013 0545   HGB 13.1 08/25/2013 0545   HCT 36.2*  08/25/2013 0545   PLT 204 08/25/2013 0545   MCV 84.8 08/25/2013 0545   MCH 30.7 08/25/2013 0545   MCHC 36.2* 08/25/2013 0545   RDW 12.3 08/25/2013 0545     BNP No results found for this basename: probnp    Lipid Panel     Component Value Date/Time   CHOL 195 08/18/2013 1709   TRIG 145 08/18/2013 1709   HDL 32* 08/18/2013 1709   CHOLHDL 6.1 08/18/2013 1709   VLDL 29 08/18/2013 1709   LDLCALC 134* 08/18/2013 1709     RADIOLOGY: No results found.   ASSESSMENT AND PLAN: Gregory Nixon is a 48 year old gentleman with cardiac risk factors notable for hypertension, hyperlipidemia, and strong family history for premature coronary artery disease in both parents as well as in his sister. He had developed fairly classic exertionally precipitated substernal chest pain associated with exertional dyspnea. Cardiac catheterization revealed severe three-vessel disease with total occlusion of his mid RCA with both antegrade collaterals to the PDA system but also retrograde collaterals supplying the distal RCA beyond the PDA takeoff. He underwent two-vessel coronary intervention to a 99% mid LAD stenosis and a 80% proximal circumflex stenosis with drug-eluting stents. He is on aspirin and Brilinta for dual antiplatelet therapy. He is on high-dose atorvastatin coupled with his fenofibrate.  I have recommended the addition of Zetia 10 mg, as well as fish oil 2 capsules daily to his medical regimen. He did experience mild headaches with initiation of nitrates. We discussed aggressive lipid therapy to hopefully induce plaque regression in induce plaque stability.in October and schedule him for one year followup.  Rest rest Myoview scan to assess scar/ischemia on medical therapy.    Lennette Bihari, MD, [facility] 04/05/2014 5:28 PM

## 2014-09-09 ENCOUNTER — Other Ambulatory Visit: Payer: Self-pay | Admitting: *Deleted

## 2014-09-09 MED ORDER — TICAGRELOR 90 MG PO TABS: 90.0000 mg | ORAL_TABLET | Freq: Two times a day (BID) | ORAL | Status: DC

## 2014-09-09 NOTE — Telephone Encounter (Signed)
Refilled electronically 

## 2014-10-07 ENCOUNTER — Encounter (HOSPITAL_COMMUNITY): Payer: Self-pay | Admitting: Cardiovascular Disease

## 2014-11-17 ENCOUNTER — Other Ambulatory Visit: Payer: Self-pay | Admitting: Cardiovascular Disease

## 2014-11-17 NOTE — Telephone Encounter (Signed)
Rx(s) sent to pharmacy electronically.  

## 2014-12-22 ENCOUNTER — Ambulatory Visit (INDEPENDENT_AMBULATORY_CARE_PROVIDER_SITE_OTHER): Payer: BC Managed Care – PPO | Admitting: Cardiovascular Disease

## 2014-12-23 ENCOUNTER — Ambulatory Visit (INDEPENDENT_AMBULATORY_CARE_PROVIDER_SITE_OTHER): Payer: BC Managed Care – PPO | Admitting: Cardiovascular Disease

## 2014-12-23 ENCOUNTER — Encounter: Payer: Self-pay | Admitting: Cardiovascular Disease

## 2014-12-23 VITALS — BP 132/74 | HR 62 | Ht 67.0 in | Wt 210.2 lb

## 2014-12-23 DIAGNOSIS — I2510 Atherosclerotic heart disease of native coronary artery without angina pectoris: Secondary | ICD-10-CM

## 2014-12-23 DIAGNOSIS — E785 Hyperlipidemia, unspecified: Secondary | ICD-10-CM

## 2014-12-23 DIAGNOSIS — Z79899 Other long term (current) drug therapy: Secondary | ICD-10-CM

## 2014-12-23 DIAGNOSIS — I10 Essential (primary) hypertension: Secondary | ICD-10-CM

## 2014-12-23 DIAGNOSIS — E669 Obesity, unspecified: Secondary | ICD-10-CM | POA: Insufficient documentation

## 2014-12-23 DIAGNOSIS — E782 Mixed hyperlipidemia: Secondary | ICD-10-CM

## 2014-12-23 MED ORDER — TICAGRELOR 60 MG PO TABS: 60.0000 mg | ORAL_TABLET | Freq: Two times a day (BID) | ORAL | Status: DC

## 2014-12-23 NOTE — Patient Instructions (Signed)
Your physician has recommended you make the following change in your medication: your Brilinta dose has been changed to 60mg  twice a day. [facility] the change.  Your physician recommends that you return for lab work fasting.  Your physician has requested that you have en exercise stress myoview in August with a follow up appointment to follow.

## 2014-12-23 NOTE — Progress Notes (Addendum)
Patient ID: Gregory Nixon, male   DOB: [birth year], 49 y.o.   MRN: [id number]      HPI: Gregory Nixon is a 49 year old male  presents to the office for a 29-month follow-up cardiology evaluation.   Gregory Nixon has a strong family history for coronary artery disease in both his mother who died at age 38 with a massive heart attack and his father who is status post CABG revascularization surgery. His sister  has had multiple stents placed in her coronary arteries. The patient has a several year history of hypertension as well as hyperlipidemia.  In October 2014 he developed progressive exertional substernal chest tightness associated with left arm radiation and exertionally precipitated shortness of breath.  He saw Dr. Manus Gunning and was prescribed isosorbide 30 mg. I saw him for initial evaluation on 08/18/2013 and was concerned that his symptoms represented progressive accelerated angina pectoris. I added beta blocker and further titrated his isosorbide therapy. Cardiac catheterization on 08/24/2013 via the right radial artery approach revealed severe three-vessel coronary artery disease. His LAD had mild 30% narrowing before the diagonal vessel but after the diagonal vessel the LAD had a 99% eccentric focal stenosis. The proximal circumflex artery had an 80% smooth focal eccentric stenosis before the takeoff of the first marginal branch and there was a 50% stenosis in the midportion of the marginal vessel. The RCA was diffusely diseased and the mid RCA was completely occluded. There were bridging collaterals extending to the PDA. The distal RCA was occluded after the PDA takeoff and he did have some distal collateralization via left coronary circulation. Ejection fraction on ventriculography was 55%. I performed successful two-vessel coronary intervention and stent to the 99% mid LAD stenosis with a 2.75x12 mm Promus premier DES stent and the left circumflex coronary artery stenosis with a 3.5x15 mm sites  expedition DES stent. He was discharged the following day on increased medical regimen.  Since this coronary intervention he denies any significant recurrent chest pain episodes.  He's been on a medical regimen consisting of isosorbide 30 mg daily, Lopressor 20 50 g twice a day, and losartan 50 mg both her blood pressure and his CAD.  He has been aggressively treated with lipid therapy on atorvastatin 80 mg, Zetia 10 mg, and fenofibrate 160 mg daily.  He does continue dual antiplatelet therapy with aspirin and Brilinta.  He's not had any subsequent laboratory.  He works at [employer] and walks.  He does not routinely exercise.  He presents for evaluation.  Past Medical History  Diagnosis Date  . Hypertension   . Gout   . High triglycerides   . Neuromuscular disorder     gout bilateral lower extremeties  . Coronary artery disease   . Pneumonia 1990's X2  . Exertional shortness of breath     "just recently" (08/24/2013)  . Arthritis     "little bit in my hands" (08/24/2013)    Past Surgical History  Procedure Laterality Date  . Cystectomy  1973 or 1974    left leg  . Inguinal hernia repair  09/04/2011    Procedure: LAPAROSCOPIC INGUINAL HERNIA;  Surgeon: Valarie Merino, MD;  Location: [facility];  Service: General;  Laterality: Right;  . Inguinal hernia repair  09/04/2011    Procedure: HERNIA REPAIR INGUINAL ADULT;  Surgeon: Valarie Merino, MD;  Location: [facility];  Service: General;  Laterality: N/A;  . Coronary angioplasty with stent placement  08/24/2013    "2 stents" (08/24/2013)  .  Inguinal hernia repair Right 05/30/11; 09/04/2011  . Left and right heart catheterization with coronary angiogram N/A 08/24/2013    Procedure: LEFT AND RIGHT HEART CATHETERIZATION WITH CORONARY ANGIOGRAM;  Surgeon: Lennette Bihari, MD;  Location: [facility] CATH LAB;  Service: Cardiovascular;  Laterality: N/A;  . Percutaneous coronary stent intervention (pci-s) N/A 08/24/2013    Procedure: PERCUTANEOUS  CORONARY STENT INTERVENTION (PCI-S);  Surgeon: Lennette Bihari, MD;  Location: [facility] CATH LAB;  Service: Cardiovascular;  Laterality: N/A;    No Known Allergies  Current Outpatient Prescriptions  Medication Sig Dispense Refill  . aspirin EC 81 MG tablet Take 81 mg by mouth daily.    Marland Kitchen atorvastatin (LIPITOR) 80 MG tablet Take 1 tablet (80 mg total) by mouth daily at 6 PM. 30 tablet 5  . ezetimibe (ZETIA) 10 MG tablet Take 1 tablet (10 mg total) by mouth daily. 30 tablet 6  . fenofibrate 160 MG tablet Take 160 mg by mouth daily.    . isosorbide mononitrate (IMDUR) 30 MG 24 hr tablet Take 30 mg by mouth daily.    Marland Kitchen losartan (COZAAR) 50 MG tablet Take 50 mg by mouth daily.    . metoprolol tartrate (LOPRESSOR) 25 MG tablet Take 1 tablet (25 mg total) by mouth 2 (two) times daily. 60 tablet 5  . nitroGLYCERIN (NITROSTAT) 0.4 MG SL tablet Place 0.4 mg under the tongue every 5 (five) minutes as needed for chest pain.    . ticagrelor (BRILINTA) 60 MG TABS tablet Take 1 tablet (60 mg total) by mouth 2 (two) times daily. 60 tablet 11   No current facility-administered medications for this visit.    Social history is notable in that he is married for 8 years. He works as a Scientist, clinical (histocompatibility and immunogenetics). He completed 12th grade of education. He has one child age 56-2 does not routinely exercise but previously had been trying to walk intermittently. There is no tobacco history. He seldom drinks beer or wine.  Family History  Problem Relation Age of Onset  . Heart disease Mother   . Heart disease Father   . Thyroid disease Father   . Heart disease Sister   . Thyroid disease Sister   . Diabetes Sister    ROS General: Negative; No fevers, chills, or night sweats; positive for mild obesity HEENT: Negative; No changes in vision or hearing, sinus congestion, difficulty swallowing Pulmonary: Negative; No cough, wheezing, shortness of breath, hemoptysis Cardiovascular: See history of present illness No chest pain,  presyncope, syncope, palpatations GI: Negative; No nausea, vomiting, diarrhea, or abdominal pain GU: Negative; No dysuria, hematuria, or difficulty voiding Musculoskeletal: Negative; no myalgias, joint pain, or weakness Hematologic/Oncology: Negative; no easy bruising, bleeding Endocrine: Negative; no heat/cold intolerance; no diabetes Neuro: Negative; no changes in balance, headaches Skin: Negative; No rashes or skin lesions Psychiatric: Negative; No behavioral problems, depression Sleep: Negative; No snoring, daytime sleepiness, hypersomnolence, bruxism, restless legs, hypnogognic hallucinations, no cataplexy Other comprehensive 14 point system review is negative.   PE BP 132/74 mmHg  Pulse 62  Ht  (1.702 m)  Wt 210 lb 3.2 oz (95.346 kg)  BMI 32.91 kg/m2 General: Alert, oriented, no distress.  Skin: normal turgor, no rashes HEENT: Normocephalic, atraumatic. Pupils round and reactive; sclera anicteric; Fundi no hemorrhages or exudates Nose without nasal septal hypertrophy Mouth/Parynx benign; Mallinpatti scale 3 Neck: No JVD, no carotid bruits normal carotid upstroke Lungs: clear to ausculatation and percussion; no wheezing or rales No chest wall tenderness to palpation. Heart: RRR,  s1 s2 normal faint 1/6 systolic murmur, no diastolic murmur.  No rubs, thrills or heaves.. Abdomen: Mild central adiposity, soft, nontender; no hepatosplenomehaly, BS+; abdominal aorta nontender and not dilated by palpation. Pulses 2+; Extremities: no clubbinbg cyanosis or edema, Homan's sign negative  Neurologic: grossly nonfocal Psychologic: Normal affect and mood  ECG (independently read by me): Normal sinus rhythm at 62 bpm.  Normal ECG.  Normal intervals.  June 2015 ECG (independently read by me): Sinus bradycardia 49 beats per minute.  No ectopy.  Normal intervals.  Mild T-wave change in lead 1 and aVL.  ECG: Normal sinus rhythm at 62 beats per minute. QTc interval 403 ms. PR interval 164  ms.  LABS:  BMET    Component Value Date/Time   NA 139 08/25/2013 0545   K 3.7 08/25/2013 0545   CL 106 08/25/2013 0545   CO2 23 08/25/2013 0545   GLUCOSE 92 08/25/2013 0545   BUN 11 08/25/2013 0545   CREATININE 0.88 08/25/2013 0545   CREATININE 0.99 08/18/2013 1709   CALCIUM 8.5 08/25/2013 0545   GFRNONAA >90 08/25/2013 0545   GFRAA >90 08/25/2013 0545     Hepatic Function Panel     Component Value Date/Time   PROT 7.0 08/18/2013 1709   ALBUMIN 4.8 08/18/2013 1709   AST 22 08/18/2013 1709   ALT 27 08/18/2013 1709   ALKPHOS 41 08/18/2013 1709   BILITOT 0.5 08/18/2013 1709     CBC    Component Value Date/Time   WBC 5.9 08/25/2013 0545   RBC 4.27 08/25/2013 0545   HGB 13.1 08/25/2013 0545   HCT 36.2* 08/25/2013 0545   PLT 204 08/25/2013 0545   MCV 84.8 08/25/2013 0545   MCH 30.7 08/25/2013 0545   MCHC 36.2* 08/25/2013 0545   RDW 12.3 08/25/2013 0545     BNP No results found for: PROBNP  Lipid Panel     Component Value Date/Time   CHOL 195 08/18/2013 1709   TRIG 145 08/18/2013 1709   HDL 32* 08/18/2013 1709   CHOLHDL 6.1 08/18/2013 1709   VLDL 29 08/18/2013 1709   LDLCALC 134* 08/18/2013 1709     RADIOLOGY: No results found.   ASSESSMENT AND PLAN: Gregory Nixon is a 49 year old gentleman with cardiac risk factors notable for hypertension, hyperlipidemia, and strong family history for premature coronary artery disease in both parents as well as in his sister. He developed fairly classic exertionally precipitated substernal chest pain associated with exertional dyspnea.  He was found to have severe three-vessel disease with total occlusion of his mid RCA with both antegrade collaterals to the PDA system but also retrograde collaterals supplying the distal RCA beyond the PDA takeoff. He underwent two-vessel coronary intervention to a 99% mid LAD stenosis and a 80% proximal circumflex stenosis with drug-eluting stents.  His blood pressure today is  controlled on his current dose of metoprolol, losartan, and isosorbide.  His resting pulse is 62.  He is on triple drug therapy for aggressive lipid-lowering intervention.  His LDL cholesterol in 2014 was elevated at 134.  Target LDL is less than 70.  He apparently has not had any recent laboratory obtained.  He continues to be on dual antiplatelet therapy.  I had along discussion with him, and in particular discussed the Pegasus trial.  In light of his premature coronary artery disease I have recommended  that he continue with dual antiplatelet therapy, but I will reduce his Brilinta dose to 60 mg twice a day per the  Pegasus trial.  He denies any significant shortness of breath.  He had RCA occlusion noted at his catheterization.  In 6 months, I am recommending he undergo a two-year exercise Myoview study following his multivessel intervention to further evaluate for scar/ischemia, and I will see him in the office for follow-up evaluation. A complete set of laboratory will be obtained next week in the fasting state and I will contact him regarding the results and if necessary.  Adjustments will be made to his medical regimen.    Lennette Biharihomas A. Koua Deeg, MD, [facility] 12/23/2014 12:14 PM

## 2015-01-01 LAB — COMPREHENSIVE METABOLIC PANEL
ALBUMIN: 4.7 g/dL (ref 3.5–5.2)
ALK PHOS: 56 U/L (ref 39–117)
ALT: 34 U/L (ref 0–53)
AST: 25 U/L (ref 0–37)
BUN: 16 mg/dL (ref 6–23)
CO2: 25 mEq/L (ref 19–32)
Calcium: 9.8 mg/dL (ref 8.4–10.5)
Chloride: 105 mEq/L (ref 96–112)
Creat: 0.98 mg/dL (ref 0.50–1.35)
Glucose, Bld: 87 mg/dL (ref 70–99)
Potassium: 4.3 mEq/L (ref 3.5–5.3)
SODIUM: 142 meq/L (ref 135–145)
Total Bilirubin: 0.5 mg/dL (ref 0.2–1.2)
Total Protein: 7.1 g/dL (ref 6.0–8.3)

## 2015-01-01 LAB — TSH: TSH: 9.85 u[IU]/mL — ABNORMAL HIGH (ref 0.350–4.500)

## 2015-01-01 LAB — LIPID PANEL
CHOLESTEROL: 184 mg/dL (ref 0–200)
HDL: 27 mg/dL — ABNORMAL LOW (ref >=40)
LDL Cholesterol: 119 mg/dL — ABNORMAL HIGH (ref 0–99)
Total CHOL/HDL Ratio: 6.8 Ratio
Triglycerides: 192 mg/dL — ABNORMAL HIGH (ref <150)
VLDL: 38 mg/dL (ref 0–40)

## 2015-01-01 LAB — CBC
HCT: 42.4 % (ref 39.0–52.0)
HEMOGLOBIN: 14.6 g/dL (ref 13.0–17.0)
MCH: 29.8 pg (ref 26.0–34.0)
MCHC: 34.4 g/dL (ref 30.0–36.0)
MCV: 86.5 fL (ref 78.0–100.0)
MPV: 9.9 fL (ref 8.6–12.4)
PLATELETS: 229 10*3/uL (ref 150–400)
RBC: 4.9 MIL/uL (ref 4.22–5.81)
RDW: 13.2 % (ref 11.5–15.5)
WBC: 5.7 10*3/uL (ref 4.0–10.5)

## 2015-01-12 ENCOUNTER — Telehealth (HOSPITAL_COMMUNITY): Payer: Self-pay | Admitting: *Deleted

## 2015-01-25 ENCOUNTER — Telehealth: Payer: Self-pay | Admitting: *Deleted

## 2015-01-25 NOTE — Telephone Encounter (Signed)
-----   Message from Lennette Biharihomas A Kelly, MD sent at 01/22/2015 11:24 AM EDT ----- Needs improved diet; add fish oild 2 capsules daily to atorvastain 80 mg and fenofibrate regimen

## 2015-01-25 NOTE — Telephone Encounter (Signed)
Left message to return a call. 

## 2015-01-26 ENCOUNTER — Telehealth (HOSPITAL_COMMUNITY): Payer: Self-pay | Admitting: *Deleted

## 2015-01-27 ENCOUNTER — Telehealth (HOSPITAL_COMMUNITY): Payer: Self-pay | Admitting: *Deleted

## 2015-04-26 ENCOUNTER — Other Ambulatory Visit: Payer: Self-pay | Admitting: Cardiovascular Disease

## 2015-04-26 NOTE — Telephone Encounter (Signed)
REFILL 

## 2015-06-16 ENCOUNTER — Telehealth (HOSPITAL_COMMUNITY): Payer: Self-pay

## 2015-06-16 NOTE — Telephone Encounter (Signed)
Encounter complete. 

## 2015-06-17 ENCOUNTER — Telehealth (HOSPITAL_COMMUNITY): Payer: Self-pay

## 2015-06-17 NOTE — Telephone Encounter (Signed)
Encounter complete. 

## 2015-06-21 ENCOUNTER — Ambulatory Visit (HOSPITAL_COMMUNITY)
Admission: RE | Admit: 2015-06-21 | Discharge: 2015-06-21 | Disposition: A | Discharge status: Home / Self Care | Payer: BLUE CROSS/BLUE SHIELD | Source: Ambulatory Visit | Attending: Cardiology | Admitting: Cardiology

## 2015-06-21 DIAGNOSIS — I10 Essential (primary) hypertension: Secondary | ICD-10-CM | POA: Diagnosis not present

## 2015-06-21 DIAGNOSIS — Z8249 Family history of ischemic heart disease and other diseases of the circulatory system: Secondary | ICD-10-CM | POA: Insufficient documentation

## 2015-06-21 DIAGNOSIS — I2510 Atherosclerotic heart disease of native coronary artery without angina pectoris: Secondary | ICD-10-CM | POA: Diagnosis not present

## 2015-06-21 MED ORDER — TECHNETIUM TC 99M SESTAMIBI GENERIC - CARDIOLITE
10.7000 | Freq: Once | INTRAVENOUS | Status: AC | PRN
Start: 2015-06-21 — End: 2015-06-21
  Administered 2015-06-21: 11 via INTRAVENOUS

## 2015-06-21 MED ORDER — TECHNETIUM TC 99M SESTAMIBI GENERIC - CARDIOLITE
30.8000 | Freq: Once | INTRAVENOUS | Status: AC | PRN
  Administered 2015-06-21: 30.8 via INTRAVENOUS

## 2015-06-21 MED ORDER — REGADENOSON 0.4 MG/5ML IV SOLN
0.4000 mg | Freq: Once | INTRAVENOUS | Status: AC
  Administered 2015-06-21: 0.4 mg via INTRAVENOUS

## 2015-06-22 LAB — MYOCARDIAL PERFUSION IMAGING
CHL CUP NUCLEAR SRS: 0
CHL CUP NUCLEAR SSS: 3
LV dias vol: 84 mL
LVSYSVOL: 36 mL
NUC STRESS TID: 1.11
Peak HR: 100 {beats}/min
Rest HR: 50 {beats}/min
SDS: 3

## 2015-07-07 ENCOUNTER — Ambulatory Visit (INDEPENDENT_AMBULATORY_CARE_PROVIDER_SITE_OTHER): Payer: BLUE CROSS/BLUE SHIELD | Admitting: Cardiovascular Disease

## 2015-07-07 ENCOUNTER — Encounter: Payer: Self-pay | Admitting: Cardiovascular Disease

## 2015-07-07 VITALS — BP 110/70 | HR 52 | Ht 67.0 in | Wt 199.6 lb

## 2015-07-07 DIAGNOSIS — E669 Obesity, unspecified: Secondary | ICD-10-CM | POA: Diagnosis not present

## 2015-07-07 DIAGNOSIS — E785 Hyperlipidemia, unspecified: Secondary | ICD-10-CM

## 2015-07-07 DIAGNOSIS — I2510 Atherosclerotic heart disease of native coronary artery without angina pectoris: Secondary | ICD-10-CM

## 2015-07-07 DIAGNOSIS — Z79899 Other long term (current) drug therapy: Secondary | ICD-10-CM

## 2015-07-07 DIAGNOSIS — I10 Essential (primary) hypertension: Secondary | ICD-10-CM | POA: Diagnosis not present

## 2015-07-07 NOTE — Patient Instructions (Signed)
Your physician recommends that you return for lab work fasting.  Your physician wants you to follow-up in: 6 months or sooner if needed with Dr. Kelly. You will receive a reminder letter in the mail two months in advance. If you don't receive a letter, please call our office to schedule the follow-up appointment. 

## 2015-07-07 NOTE — Progress Notes (Signed)
Patient ID: [name], male   DOB: 10-21-1966, 49 y.o.   MRN: [id number]      HPI: [name] is a 49 year old male  presents to the office for a 60-month follow-up cardiology evaluation.   [name] has a strong family history for CAD in both his mother who died at age 2 with a massive heart attack and his father who is status post CABG revascularization surgery. His sister  has had multiple stents placed in her coronary arteries. The patient has a several year history of hypertension as well as hyperlipidemia.  In October 2014 he developed progressive exertional substernal chest tightness associated with left arm radiation and exertionally precipitated shortness of breath.  He saw Dr. Marisue Humble and was prescribed isosorbide 30 mg. I saw him for initial evaluation on 08/18/2013 and was concerned that his symptoms represented progressive accelerated angina pectoris. I added beta blocker and further titrated his isosorbide therapy. Cardiac catheterization on 08/24/2013 via the right radial artery approach revealed severe three-vessel coronary artery disease. His LAD had mild 30% narrowing before the diagonal vessel but after the diagonal vessel the LAD had a 99% eccentric focal stenosis. The proximal circumflex artery had an 80% smooth focal eccentric stenosis before the takeoff of the first marginal branch and there was a 50% stenosis in the midportion of the marginal vessel. The RCA was diffusely diseased and the mid RCA was completely occluded. There were bridging collaterals extending to the PDA. The distal RCA was occluded after the PDA takeoff and he did have some distal collateralization via left coronary circulation. Ejection fraction on ventriculography was 55%. I performed successful two-vessel coronary intervention and stent to the 99% mid LAD stenosis with a 2.75x12 mm Promus premier DES stent and the left circumflex coronary artery stenosis with a 3.5x15 mm sites expedition DES stent.  He was discharged the following day on increased medical regimen.  Since his coronary intervention he denies any significant recurrent chest pain episodes.  He has  been on a medical regimen consisting of isosorbide 30 mg daily, Lopressor 20 50 g twice a day, and losartan 50 mg both her blood pressure and his CAD.  He has been aggressively treated with lipid therapy on atorvastatin 80 mg, Zetia 10 mg, and fenofibrate 160 mg daily.  He is on  dual antiplatelet therapy with aspirin and Brilinta at reduced dose per the Pegasus trial.  He works at [employer] and walks.  He underwent a nuclear perfusion study on 06/22/2015.  This was normal and revealed normal perfusion without evidence for prior infarction or evidence for ischemia.  Ejection fraction was 57% and he had normal wall motion.  He presents for evaluation  Past Medical History  Diagnosis Date  . Hypertension   . Gout   . High triglycerides   . Neuromuscular disorder     gout bilateral lower extremeties  . Coronary artery disease   . Pneumonia 1990's X2  . Exertional shortness of breath     "just recently" (08/24/2013)  . Arthritis     "little bit in my hands" (08/24/2013)    Past Surgical History  Procedure Laterality Date  . Cystectomy  1973 or 1974    left leg  . Inguinal hernia repair  09/04/2011    Procedure: LAPAROSCOPIC INGUINAL HERNIA;  Surgeon: Pedro Earls, MD;  Location: [facility];  Service: General;  Laterality: Right;  . Inguinal hernia repair  09/04/2011    Procedure: HERNIA REPAIR INGUINAL ADULT;  Surgeon: Pedro Earls, MD;  Location: [facility];  Service: General;  Laterality: N/A;  . Coronary angioplasty with stent placement  08/24/2013    "2 stents" (08/24/2013)  . Inguinal hernia repair Right 05/30/11; 09/04/2011  . Left and right heart catheterization with coronary angiogram N/A 08/24/2013    Procedure: LEFT AND RIGHT HEART CATHETERIZATION WITH CORONARY ANGIOGRAM;  Surgeon: Troy Sine, MD;  Location:  [facility] CATH LAB;  Service: Cardiovascular;  Laterality: N/A;  . Percutaneous coronary stent intervention (pci-s) N/A 08/24/2013    Procedure: PERCUTANEOUS CORONARY STENT INTERVENTION (PCI-S);  Surgeon: Troy Sine, MD;  Location: [facility] CATH LAB;  Service: Cardiovascular;  Laterality: N/A;    No Known Allergies  Current Outpatient Prescriptions  Medication Sig Dispense Refill  . aspirin EC 81 MG tablet Take 81 mg by mouth daily.    Marland Kitchen atorvastatin (LIPITOR) 80 MG tablet Take 1 tablet (80 mg total) by mouth daily at 6 PM. 30 tablet 5  . ezetimibe (ZETIA) 10 MG tablet Take 1 tablet (10 mg total) by mouth daily. 30 tablet 6  . fenofibrate 160 MG tablet Take 160 mg by mouth daily.    . isosorbide mononitrate (IMDUR) 30 MG 24 hr tablet Take 30 mg by mouth daily.    Marland Kitchen losartan (COZAAR) 50 MG tablet Take 50 mg by mouth daily.    . metoprolol tartrate (LOPRESSOR) 25 MG tablet Take 1 tablet (25 mg total) by mouth 2 (two) times daily. 60 tablet 5  . NITROSTAT 0.4 MG SL tablet PLACE 1 TABLET UNDER TONGUE EVERY 5 MINUTES ASNEEDED FOR CHEST PAIN. MAX 3 DOSES. 25 tablet 3  . ticagrelor (BRILINTA) 60 MG TABS tablet Take 1 tablet (60 mg total) by mouth 2 (two) times daily. 60 tablet 11   No current facility-administered medications for this visit.    Social history is notable in that he is married for 9 years. He works as a Presenter, broadcasting. He completed 12th grade of education. He has one child age 57-2 does not routinely exercise but previously had been trying to walk intermittently. There is no tobacco history. He seldom drinks beer or wine.  Family History  Problem Relation Age of Onset  . Heart disease Mother   . Heart disease Father   . Thyroid disease Father   . Heart disease Sister   . Thyroid disease Sister   . Diabetes Sister    ROS General: Negative; No fevers, chills, or night sweats; positive for mild obesity HEENT: Negative; No changes in vision or hearing, sinus congestion, difficulty  swallowing Pulmonary: Negative; No cough, wheezing, shortness of breath, hemoptysis Cardiovascular: See history of present illness  GI: Negative; No nausea, vomiting, diarrhea, or abdominal pain GU: Negative; No dysuria, hematuria, or difficulty voiding Musculoskeletal: Negative; no myalgias, joint pain, or weakness Hematologic/Oncology: Negative; no easy bruising, bleeding Endocrine: Negative; no heat/cold intolerance; no diabetes Neuro: Negative; no changes in balance, headaches Skin: Negative; No rashes or skin lesions Psychiatric: Negative; No behavioral problems, depression Sleep: Negative; No snoring, daytime sleepiness, hypersomnolence, bruxism, restless legs, hypnogognic hallucinations, no cataplexy Other comprehensive 14 point system review is negative.   PE BP 110/70 mmHg  Pulse 52  Ht $R'5\' 7"'Uc$  (1.702 m)  Wt 199 lb 9.6 oz (90.538 kg)  BMI 31.25 kg/m2  Wt Readings from Last 3 Encounters:  07/07/15 199 lb 9.6 oz (90.538 kg)  06/21/15 210 lb (95.255 kg)  12/23/14 210 lb 3.2 oz (95.346 kg)   General: Alert, oriented, no  distress.  Skin: normal turgor, no rashes HEENT: Normocephalic, atraumatic. Pupils round and reactive; sclera anicteric; Fundi no hemorrhages or exudates Nose without nasal septal hypertrophy Mouth/Parynx benign; Mallinpatti scale 3 Neck: No JVD, no carotid bruits; normal carotid upstroke Lungs: clear to ausculatation and percussion; no wheezing or rales No chest wall tenderness to palpation. Heart: RRR, s1 s2 normal faint 1/6 systolic murmur, no diastolic murmur.  No rubs, thrills or heaves.. Abdomen: Mild central adiposity, soft, nontender; no hepatosplenomehaly, BS+; abdominal aorta nontender and not dilated by palpation. Pulses 2+; Extremities: no clubbinbg cyanosis or edema, Homan's sign negative  Neurologic: grossly nonfocalftl Psychologic: Normal affect and mood   February 2016 ECG (independently read by me): Normal sinus rhythm at 62 bpm.  Normal  ECG.  Normal intervals.  June 2015 ECG (independently read by me): Sinus bradycardia 49 beats per minute.  No ectopy.  Normal intervals.  Mild T-wave change in lead 1 and aVL.  ECG: Normal sinus rhythm at 62 beats per minute. QTc interval 403 ms. PR interval 164 ms.  LABS: BMP Latest Ref Rng 12/31/2014 08/25/2013 08/18/2013  Glucose 70 - 99 mg/dL 87 92 85  BUN 6 - 23 mg/dL _0 Creatinine 0.50 - 1.35 mg/dL 0.98 0.88 0.99  Sodium 135 - 145 mEq/L 142 139 139  Potassium 3.5 - 5.3 mEq/L 4.3 3.7 4.6  Chloride 96 - 112 mEq/L 105 106 103  CO2 19 - 32 mEq/L _1 Calcium 8.4 - 10.5 mg/dL 9.8 8.5 10.0   Hepatic Function Latest Ref Rng 12/31/2014 08/18/2013 07/28/2012  Total Protein 6.0 - 8.3 g/dL 7.1 7.0 7.5  Albumin 3.5 - 5.2 g/dL 4.7 4.8 5.1  AST 0 - 37 U/L _2 ALT 0 - 53 U/L 34 27 32  Alk Phosphatase 39 - 117 U/L 56 41 73  Total Bilirubin 0.2 - 1.2 mg/dL 0.5 0.5 0.6   CBC Latest Ref Rng 12/31/2014 08/25/2013 08/18/2013  WBC 4.0 - 10.5 K/uL 5.7 5.9 5.3  Hemoglobin 13.0 - 17.0 g/dL 14.6 13.1 14.5  Hematocrit 39.0 - 52.0 % 42.4 36.2(L) 40.7  Platelets 150 - 400 K/uL 229 204 264   Lab Results  Component Value Date   MCV 86.5 12/31/2014   MCV 84.8 08/25/2013   MCV 85.3 08/18/2013   Lab Results  Component Value Date   TSH 9.850* 12/31/2014   Lab Results  Component Value Date   HGBA1C 4.9 07/28/2012   Lipid Panel     Component Value Date/Time   CHOL 184 12/31/2014 1615   TRIG 192* 12/31/2014 1615   HDL 27* 12/31/2014 1615   CHOLHDL 6.8 12/31/2014 1615   VLDL 38 12/31/2014 1615   LDLCALC 119* 12/31/2014 1615    RADIOLOGY: No results found.   ASSESSMENT AND PLAN: Gregory Nixon is a 49 year old gentleman with cardiac risk factors notable for hypertension, hyperlipidemia, and strong family history for premature coronary artery disease in both parents as well as in his sister. He developed fairly classic exertionally precipitated substernal chest pain associated  with exertional dyspnea.  He was found to have severe three-vessel disease with total occlusion of his mid RCA with both antegrade collaterals to the PDA system but also retrograde collaterals supplying the distal RCA beyond the PDA takeoff. He underwent two-vessel coronary intervention to a 99% mid LAD stenosis and a 80% proximal circumflex stenosis with drug-eluting stents.  His blood pressure today is controlled on his current dose of metoprolol, losartan, and isosorbide.  I reviewed his most recent nuclear perfusion study, which shows entirely normal perfusion without evidence for scar or ischemia.  He continues to do well on dual antiplatelets therapy long-term with Brilinta 60 mg twice a day and aspirin 81 mg per Pegasys trial data.  I discussed importance of weight loss with his BMI of 31.25 which is consistent with mild obesity.  His blood pressure remained stable.  I am adding omega 3 fatty acids to his medical regimen, particularly with his history of elevated triglycerides.  Otherwise, he is on maximal therapy with atorvastatin, fenofibrate and Zetia.  I discussed with him the importance of exercise at least 5 days per week for minimum of 30 minutes.  I will see him in 6 months for cardiology reevaluation.   Troy Sine, MD, [facility] 07/07/2015 7:51 PM

## 2015-07-13 ENCOUNTER — Telehealth: Payer: Self-pay | Admitting: Cardiovascular Disease

## 2015-07-13 ENCOUNTER — Telehealth: Payer: Self-pay

## 2015-07-13 MED ORDER — ISOSORBIDE MONONITRATE ER 30 MG PO TB24
30.0000 mg | ORAL_TABLET | Freq: Every day | ORAL | Status: DC
Start: 2015-07-13 — End: 2015-11-28

## 2015-07-13 NOTE — Telephone Encounter (Signed)
LVM that

## 2015-07-13 NOTE — Telephone Encounter (Signed)
Calledand left message with Benn Tarver that medication has been ordered from [facility] on [address]

## 2015-07-13 NOTE — Telephone Encounter (Signed)
°  1. Which medications need to be refilled? Isosorbide Monoitrate     2. Which pharmacy is medication to be sent to?[facility] on [address]   3. Do they need a 30 day or 90 day supply? 30  4. Would they like a call back once the medication has been sent to the pharmacy? yes

## 2015-08-04 ENCOUNTER — Other Ambulatory Visit: Payer: Self-pay | Admitting: Cardiovascular Disease

## 2015-08-04 NOTE — Telephone Encounter (Signed)
Rx request sent to pharmacy.  

## 2015-09-12 ENCOUNTER — Emergency Department (HOSPITAL_COMMUNITY)
Admission: EM | Admit: 2015-09-12 | Discharge: 2015-09-12 | Disposition: A | Discharge status: Home / Self Care | Payer: BLUE CROSS/BLUE SHIELD | Attending: Emergency Medicine | Admitting: Emergency Medicine

## 2015-09-12 ENCOUNTER — Encounter (HOSPITAL_COMMUNITY): Payer: Self-pay | Admitting: Family Medicine

## 2015-09-12 ENCOUNTER — Emergency Department (HOSPITAL_COMMUNITY): Payer: BLUE CROSS/BLUE SHIELD

## 2015-09-12 DIAGNOSIS — I10 Essential (primary) hypertension: Secondary | ICD-10-CM | POA: Diagnosis not present

## 2015-09-12 DIAGNOSIS — M19041 Primary osteoarthritis, right hand: Secondary | ICD-10-CM | POA: Diagnosis not present

## 2015-09-12 DIAGNOSIS — Z7982 Long term (current) use of aspirin: Secondary | ICD-10-CM | POA: Insufficient documentation

## 2015-09-12 DIAGNOSIS — E781 Pure hyperglyceridemia: Secondary | ICD-10-CM | POA: Insufficient documentation

## 2015-09-12 DIAGNOSIS — Z9861 Coronary angioplasty status: Secondary | ICD-10-CM | POA: Diagnosis not present

## 2015-09-12 DIAGNOSIS — I2510 Atherosclerotic heart disease of native coronary artery without angina pectoris: Secondary | ICD-10-CM | POA: Diagnosis not present

## 2015-09-12 DIAGNOSIS — M19042 Primary osteoarthritis, left hand: Secondary | ICD-10-CM | POA: Insufficient documentation

## 2015-09-12 DIAGNOSIS — Z8701 Personal history of pneumonia (recurrent): Secondary | ICD-10-CM | POA: Diagnosis not present

## 2015-09-12 DIAGNOSIS — N201 Calculus of ureter: Secondary | ICD-10-CM | POA: Diagnosis not present

## 2015-09-12 DIAGNOSIS — Z7902 Long term (current) use of antithrombotics/antiplatelets: Secondary | ICD-10-CM | POA: Insufficient documentation

## 2015-09-12 DIAGNOSIS — Z9889 Other specified postprocedural states: Secondary | ICD-10-CM | POA: Insufficient documentation

## 2015-09-12 DIAGNOSIS — Z79899 Other long term (current) drug therapy: Secondary | ICD-10-CM | POA: Insufficient documentation

## 2015-09-12 DIAGNOSIS — Z8669 Personal history of other diseases of the nervous system and sense organs: Secondary | ICD-10-CM | POA: Diagnosis not present

## 2015-09-12 DIAGNOSIS — R1031 Right lower quadrant pain: Secondary | ICD-10-CM | POA: Diagnosis present

## 2015-09-12 LAB — CBC WITH DIFFERENTIAL/PLATELET
BASOS ABS: 0 10*3/uL (ref 0.0–0.1)
BASOS PCT: 0 %
EOS ABS: 0 10*3/uL (ref 0.0–0.7)
Eosinophils Relative: 0 %
HEMATOCRIT: 41.5 % (ref 39.0–52.0)
HEMOGLOBIN: 15.1 g/dL (ref 13.0–17.0)
Lymphocytes Relative: 7 %
Lymphs Abs: 1 10*3/uL (ref 0.7–4.0)
MCH: 30.9 pg (ref 26.0–34.0)
MCHC: 36.4 g/dL — AB (ref 30.0–36.0)
MCV: 84.9 fL (ref 78.0–100.0)
Monocytes Absolute: 1.4 10*3/uL — ABNORMAL HIGH (ref 0.1–1.0)
Monocytes Relative: 10 %
NEUTROS ABS: 12.3 10*3/uL — AB (ref 1.7–7.7)
NEUTROS PCT: 83 %
Platelets: 225 10*3/uL (ref 150–400)
RBC: 4.89 MIL/uL (ref 4.22–5.81)
RDW: 12.3 % (ref 11.5–15.5)
WBC: 14.7 10*3/uL — AB (ref 4.0–10.5)

## 2015-09-12 LAB — URINALYSIS, ROUTINE W REFLEX MICROSCOPIC
BILIRUBIN URINE: NEGATIVE
GLUCOSE, UA: NEGATIVE mg/dL
Hgb urine dipstick: NEGATIVE
Ketones, ur: NEGATIVE mg/dL
Leukocytes, UA: NEGATIVE
NITRITE: NEGATIVE
PH: 5.5 (ref 5.0–8.0)
Protein, ur: NEGATIVE mg/dL
SPECIFIC GRAVITY, URINE: 1.036 — AB (ref 1.005–1.030)
Urobilinogen, UA: 0.2 mg/dL (ref 0.0–1.0)

## 2015-09-12 LAB — COMPREHENSIVE METABOLIC PANEL
ALBUMIN: 4.8 g/dL (ref 3.5–5.0)
ALK PHOS: 48 U/L (ref 38–126)
ALT: 31 U/L (ref 17–63)
ANION GAP: 11 (ref 5–15)
AST: 26 U/L (ref 15–41)
BILIRUBIN TOTAL: 1.2 mg/dL (ref 0.3–1.2)
BUN: 20 mg/dL (ref 6–20)
CALCIUM: 9.8 mg/dL (ref 8.9–10.3)
CO2: 25 mmol/L (ref 22–32)
Chloride: 101 mmol/L (ref 101–111)
Creatinine, Ser: 1.52 mg/dL — ABNORMAL HIGH (ref 0.61–1.24)
GFR calc Af Amer: >60 mL/min (ref >60)
GFR calc non Af Amer: 52 mL/min — ABNORMAL LOW (ref >60)
GLUCOSE: 121 mg/dL — AB (ref 65–99)
Potassium: 3.9 mmol/L (ref 3.5–5.1)
SODIUM: 137 mmol/L (ref 135–145)
TOTAL PROTEIN: 7.7 g/dL (ref 6.5–8.1)

## 2015-09-12 LAB — LIPASE, BLOOD: Lipase: 28 U/L (ref 11–51)

## 2015-09-12 MED ORDER — OXYCODONE-ACETAMINOPHEN 7.5-325 MG PO TABS: 1.0000 | ORAL_TABLET | ORAL | Status: DC | PRN

## 2015-09-12 MED ORDER — ONDANSETRON HCL 4 MG/2ML IJ SOLN
4.0000 mg | Freq: Once | INTRAMUSCULAR | Status: AC
  Administered 2015-09-12: 4 mg via INTRAVENOUS
  Filled 2015-09-12: qty 2

## 2015-09-12 MED ORDER — TAMSULOSIN HCL 0.4 MG PO CAPS: 0.4000 mg | ORAL_CAPSULE | Freq: Every day | ORAL | Status: DC

## 2015-09-12 MED ORDER — MORPHINE SULFATE (PF) 4 MG/ML IV SOLN
4.0000 mg | Freq: Once | INTRAVENOUS | Status: AC
  Administered 2015-09-12: 4 mg via INTRAVENOUS
  Filled 2015-09-12: qty 1

## 2015-09-12 MED ORDER — SODIUM CHLORIDE 0.9 % IV SOLN
1000.0000 mL | INTRAVENOUS | Status: DC
  Administered 2015-09-12: 1000 mL via INTRAVENOUS

## 2015-09-12 MED ORDER — IOHEXOL 300 MG/ML  SOLN
100.0000 mL | Freq: Once | INTRAMUSCULAR | Status: AC | PRN
  Administered 2015-09-12: 100 mL via INTRAVENOUS

## 2015-09-12 MED ORDER — SODIUM CHLORIDE 0.9 % IV SOLN
1000.0000 mL | Freq: Once | INTRAVENOUS | Status: AC
  Administered 2015-09-12: 1000 mL via INTRAVENOUS

## 2015-09-12 MED ORDER — ONDANSETRON HCL 4 MG PO TABS: 4.0000 mg | ORAL_TABLET | Freq: Four times a day (QID) | ORAL | Status: DC | PRN

## 2015-09-12 MED ORDER — IOHEXOL 300 MG/ML  SOLN
25.0000 mL | Freq: Once | INTRAMUSCULAR | Status: AC | PRN
  Administered 2015-09-12: 25 mL via ORAL

## 2015-09-12 NOTE — ED Provider Notes (Signed)
CSN: [id number]     Arrival date & time 09/12/15  4098 History   First MD Initiated Contact with Patient 09/12/15 0541     Chief Complaint  Patient presents with  . Abdominal Pain     (Consider location/radiation/quality/duration/timing/severity/associated sxs/prior Treatment) Patient is a 49 y.o. male presenting with abdominal pain. The history is provided by the patient.  Abdominal Pain He had onset yesterday of pain in the right lower abdomen which radiates around to the right side of his back. Pain is sharp and severe and he rates it at 8/10. It tends to come in waves and will get as severe as 10/10. There is associated nausea and vomiting. Vomiting does not affect the pain. He denies fever, chills, sweats. He denies constipation or diarrhea. He denies urinary symptoms. He has taken ibuprofen for pain without any relief. He had similar pain about 2 weeks ago which was not as severe.  Past Medical History  Diagnosis Date  . Hypertension   . Gout   . High triglycerides   . Neuromuscular disorder (HCC)     gout bilateral lower extremeties  . Coronary artery disease   . Pneumonia 1990's X2  . Exertional shortness of breath     "just recently" (08/24/2013)  . Arthritis     "little bit in my hands" (08/24/2013)   Past Surgical History  Procedure Laterality Date  . Cystectomy  1973 or 1974    left leg  . Inguinal hernia repair  09/04/2011    Procedure: LAPAROSCOPIC INGUINAL HERNIA;  Surgeon: Valarie Merino, MD;  Location: [facility];  Service: General;  Laterality: Right;  . Inguinal hernia repair  09/04/2011    Procedure: HERNIA REPAIR INGUINAL ADULT;  Surgeon: Valarie Merino, MD;  Location: [facility];  Service: General;  Laterality: N/A;  . Coronary angioplasty with stent placement  08/24/2013    "2 stents" (08/24/2013)  . Inguinal hernia repair Right 05/30/11; 09/04/2011  . Left and right heart catheterization with coronary angiogram N/A 08/24/2013    Procedure: LEFT AND RIGHT HEART  CATHETERIZATION WITH CORONARY ANGIOGRAM;  Surgeon: Lennette Bihari, MD;  Location: [facility] CATH LAB;  Service: Cardiovascular;  Laterality: N/A;  . Percutaneous coronary stent intervention (pci-s) N/A 08/24/2013    Procedure: PERCUTANEOUS CORONARY STENT INTERVENTION (PCI-S);  Surgeon: Lennette Bihari, MD;  Location: [facility] CATH LAB;  Service: Cardiovascular;  Laterality: N/A;   Family History  Problem Relation Age of Onset  . Heart disease Mother   . Heart disease Father   . Thyroid disease Father   . Heart disease Sister   . Thyroid disease Sister   . Diabetes Sister    Social History  Substance Use Topics  . Smoking status: Never Smoker   . Smokeless tobacco: Never Used  . Alcohol Use: No    Review of Systems  Gastrointestinal: Positive for abdominal pain.  All other systems reviewed and are negative.     Allergies  Review of patient's allergies indicates no known allergies.  Home Medications   Prior to Admission medications   Medication Sig Start Date End Date Taking? Authorizing Provider  aspirin EC 81 MG tablet Take 81 mg by mouth daily.    Historical Provider, MD  atorvastatin (LIPITOR) 80 MG tablet Take 1 tablet (80 mg total) by mouth daily at 6 PM. 08/25/13   Dwana Melena, PA-C  ezetimibe (ZETIA) 10 MG tablet Take 1 tablet (10 mg total) by mouth daily. 04/01/14   Lennette Bihari,  MD  fenofibrate 160 MG tablet Take 160 mg by mouth daily.    Historical Provider, MD  isosorbide mononitrate (IMDUR) 30 MG 24 hr tablet Take 1 tablet (30 mg total) by mouth daily. 07/13/15   Lennette Bihari, MD  losartan (COZAAR) 50 MG tablet Take 50 mg by mouth daily.    Historical Provider, MD  metoprolol tartrate (LOPRESSOR) 25 MG tablet TAKE 1 TABLET (25 MG TOTAL) BY MOUTH 2 (TWO) TIMES DAILY. 08/04/15   Lennette Bihari, MD  NITROSTAT 0.4 MG SL tablet PLACE 1 TABLET UNDER TONGUE EVERY 5 MINUTES ASNEEDED FOR CHEST PAIN. MAX 3 DOSES. 04/26/15   Lennette Bihari, MD  ticagrelor (BRILINTA) 60 MG TABS tablet  Take 1 tablet (60 mg total) by mouth 2 (two) times daily. 12/23/14   Lennette Bihari, MD   BP 158/78 mmHg  Pulse 61  Temp(Src) 98.1 F (36.7 C) (Oral)  Resp 20  Ht  (1.702 m)  Wt 195 lb (88.451 kg)  BMI 30.53 kg/m2  SpO2 96% Physical Exam  Nursing note and vitals reviewed.  49 year old male, resting comfortably and in no acute distress. Vital signs are significant for hypertension. Oxygen saturation is 96%, which is normal. Head is normocephalic and atraumatic. PERRLA, EOMI. Oropharynx is clear. Neck is nontender and supple without adenopathy or JVD. Back is nontender in the midline. There is mild right CVA tenderness. Lungs are clear without rales, wheezes, or rhonchi. Chest is nontender. Heart has regular rate and rhythm without murmur. Abdomen is soft, flat, with moderate tenderness in the right lower abdomen with plus/minus rebound. There is no guarding. There are no masses or hepatosplenomegaly and peristalsis is hypoactive. Extremities have no cyanosis or edema, full range of motion is present. Skin is warm and dry without rash. Neurologic: Mental status is normal, cranial nerves are intact, there are no motor or sensory deficits.  ED Course  Procedures (including critical care time) Labs Review Results for orders placed or performed during the hospital encounter of 09/12/15  CBC with Differential  Result Value Ref Range   WBC 14.7 (H) 4.0 - 10.5 K/uL   RBC 4.89 4.22 - 5.81 MIL/uL   Hemoglobin 15.1 13.0 - 17.0 g/dL   HCT 45.4 09.8 - 11.9 %   MCV 84.9 78.0 - 100.0 fL   MCH 30.9 26.0 - 34.0 pg   MCHC 36.4 (H) 30.0 - 36.0 g/dL   RDW 14.7 82.9 - 56.2 %   Platelets 225 150 - 400 K/uL   Neutrophils Relative % 83 %   Neutro Abs 12.3 (H) 1.7 - 7.7 K/uL   Lymphocytes Relative 7 %   Lymphs Abs 1.0 0.7 - 4.0 K/uL   Monocytes Relative 10 %   Monocytes Absolute 1.4 (H) 0.1 - 1.0 K/uL   Eosinophils Relative 0 %   Eosinophils Absolute 0.0 0.0 - 0.7 K/uL   Basophils Relative  0 %   Basophils Absolute 0.0 0.0 - 0.1 K/uL  Comprehensive metabolic panel  Result Value Ref Range   Sodium 137 135 - 145 mmol/L   Potassium 3.9 3.5 - 5.1 mmol/L   Chloride 101 101 - 111 mmol/L   CO2 25 22 - 32 mmol/L   Glucose, Bld 121 (H) 65 - 99 mg/dL   BUN 20 6 - 20 mg/dL   Creatinine, Ser 1.30 (H) 0.61 - 1.24 mg/dL   Calcium 9.8 8.9 - 86.5 mg/dL   Total Protein 7.7 6.5 - 8.1 g/dL   Albumin  4.8 3.5 - 5.0 g/dL   AST 26 15 - 41 U/L   ALT 31 17 - 63 U/L   Alkaline Phosphatase 48 38 - 126 U/L   Total Bilirubin 1.2 0.3 - 1.2 mg/dL   GFR calc non Af Amer 52 (L) >60 mL/min   GFR calc Af Amer >60 >60 mL/min   Anion gap 11 5 - 15  Lipase, blood  Result Value Ref Range   Lipase 28 11 - 51 U/L   Imaging Review Ct Abdomen Pelvis W Contrast  09/12/2015  CLINICAL DATA:  Right lower quadrant pain.  Nausea. EXAM: CT ABDOMEN AND PELVIS WITH CONTRAST TECHNIQUE: Multidetector CT imaging of the abdomen and pelvis was performed using the standard protocol following bolus administration of intravenous contrast. CONTRAST:  100mL OMNIPAQUE IOHEXOL 300 MG/ML  SOLN COMPARISON:  None. FINDINGS: Lower chest:  Normal. Hepatobiliary: Normal. Pancreas: Normal. Spleen: Normal. Adrenals/Urinary Tract: Normal adrenal glands. 7 x 6 mm stone obstructing the proximal right ureter just below the ureteropelvic junction. There is also an 8 mm lobulated stone which is either within or immediately adjacent to the distal right ureter but the ureter above this level is not dilated 2 mm stone in the mid right kidney. 8 mm stone in the upper pole of the left kidney. The bladder is normal. Stomach/Bowel: Normal. Vascular/Lymphatic: Slight calcification in the iliac arteries. No adenopathy. Reproductive: Normal. Other: No free air. Soft tissue stranding around the right kidney adjacent to the second and third portions of the duodenum due to urine extravasation. Musculoskeletal: Negative. IMPRESSION: 7 x 6 mm stone obstructing the  proximal right ureter. Moderate right hydronephrosis with extravasated urine. 8 mm stone in or adjacent to the distal right ureter. The ureter is not dilated above the stone therefore I suspect this calcification is extrinsic to the ureter. Electronically Signed   By: Francene BoyersJames  Maxwell M.D.   On: 09/12/2015 07:17   I have personally reviewed and evaluated these images and lab results as part of my medical decision-making.   MDM   Final diagnoses:  Ureterolithiasis    Right lower quadrant pain which is somewhat concerning for possible appendicitis. However, the fact that there was recent episode of pain that resolved before recurring would be very atypical for appendicitis. This would be more typical of renal colic. He will be sent for CT scan to evaluate for possible appendicitis, possible ureteral calculus.  CT scan confirms 6 x 7 mm calculus in the proximal right ureter with obstruction which apparently is what is causing his pain. Because of size and location, this is unlikely to pass on its own. It is easily visible on the topogram, and he likely would be a good candidate for lithotripsy. He is referred to urology for follow-up and is discharged with prescriptions for oxycodone-acetaminophen, tamsulosin, and ondansetron. Return precautions given.  Dione Boozeavid Frady Taddeo, MD 09/12/15 [phone]

## 2015-09-12 NOTE — ED Notes (Signed)
Pt reports experiencing right lower quad pain that radiates to right lower back. Pain started all of sudden yesterday. Had the same pain about two weeks ago. Fever at home. Nausea and denies urinary symptoms.

## 2015-09-12 NOTE — Discharge Instructions (Signed)
Return if you are not getting adequate pain control, your vomiting in spite of medication, or if you start running a high fever. Otherwise, follow-up with the urologist as soon as possible.   Kidney Stones Kidney stones (urolithiasis) are deposits that form inside your kidneys. The intense pain is caused by the stone moving through the urinary tract. When the stone moves, the ureter goes into spasm around the stone. The stone is usually passed in the urine.  CAUSES   A disorder that makes certain neck glands produce too much parathyroid hormone (primary hyperparathyroidism).  A buildup of uric acid crystals, similar to gout in your joints.  Narrowing (stricture) of the ureter.  A kidney obstruction present at birth (congenital obstruction).  Previous surgery on the kidney or ureters.  Numerous kidney infections. SYMPTOMS   Feeling sick to your stomach (nauseous).  Throwing up (vomiting).  Blood in the urine (hematuria).  Pain that usually spreads (radiates) to the groin.  Frequency or urgency of urination. DIAGNOSIS   Taking a history and physical exam.  Blood or urine tests.  CT scan.  Occasionally, an examination of the inside of the urinary bladder (cystoscopy) is performed. TREATMENT   Observation.  Increasing your fluid intake.  Extracorporeal shock wave lithotripsy--This is a noninvasive procedure that uses shock waves to break up kidney stones.  Surgery may be needed if you have severe pain or persistent obstruction. There are various surgical procedures. Most of the procedures are performed with the use of small instruments. Only small incisions are needed to accommodate these instruments, so recovery time is minimized. The size, location, and chemical composition are all important variables that will determine the proper choice of action for you. Talk to your health care provider to better understand your situation so that you will minimize the risk of injury  to yourself and your kidney.  HOME CARE INSTRUCTIONS   Drink enough water and fluids to keep your urine clear or pale yellow. This will help you to pass the stone or stone fragments.  Strain all urine through the provided strainer. Keep all particulate matter and stones for your health care provider to see. The stone causing the pain may be as small as a grain of salt. It is very important to use the strainer each and every time you pass your urine. The collection of your stone will allow your health care provider to analyze it and verify that a stone has actually passed. The stone analysis will often identify what you can do to reduce the incidence of recurrences.  Only take over-the-counter or prescription medicines for pain, discomfort, or fever as directed by your health care provider.  Keep all follow-up visits as told by your health care provider. This is important.  Get follow-up X-rays if required. The absence of pain does not always mean that the stone has passed. It may have only stopped moving. If the urine remains completely obstructed, it can cause loss of kidney function or even complete destruction of the kidney. It is your responsibility to make sure X-rays and follow-ups are completed. Ultrasounds of the kidney can show blockages and the status of the kidney. Ultrasounds are not associated with any radiation and can be performed easily in a matter of minutes.  Make changes to your daily diet as told by your health care provider. You may be told to:  Limit the amount of salt that you eat.  Eat 5 or more servings of fruits and vegetables each day.  Limit the amount of meat, poultry, fish, and eggs that you eat.  Collect a 24-hour urine sample as told by your health care provider.You may need to collect another urine sample every 6-12 months. SEEK MEDICAL CARE IF:  You experience pain that is progressive and unresponsive to any pain medicine you have been prescribed. SEEK  IMMEDIATE MEDICAL CARE IF:   Pain cannot be controlled with the prescribed medicine.  You have a fever or shaking chills.  The severity or intensity of pain increases over 18 hours and is not relieved by pain medicine.  You develop a new onset of abdominal pain.  You feel faint or pass out.  You are unable to urinate.   This information is not intended to replace advice given to you by your health care provider. Make sure you discuss any questions you have with your health care provider.   Document Released: 10/15/2005 Document Revised: 07/06/2015 Document Reviewed: 03/18/2013 Elsevier Interactive Patient Education 2016 [employer]  Acetaminophen; Oxycodone tablets What is this medicine? ACETAMINOPHEN; OXYCODONE (a set a MEE noe fen; ox i KOE done) is a pain reliever. It is used to treat moderate to severe pain. This medicine may be used for other purposes; ask your health care provider or pharmacist if you have questions. What should I tell my health care provider before I take this medicine? They need to know if you have any of these conditions: -brain tumor -Crohn's disease, inflammatory bowel disease, or ulcerative colitis -drug abuse or addiction -head injury -heart or circulation problems -if you often drink alcohol -kidney disease or problems going to the bathroom -liver disease -lung disease, asthma, or breathing problems -an unusual or allergic reaction to acetaminophen, oxycodone, other opioid analgesics, other medicines, foods, dyes, or preservatives -pregnant or trying to get pregnant -breast-feeding How should I use this medicine? Take this medicine by mouth with a full glass of water. Follow the directions on the prescription label. You can take it with or without food. If it upsets your stomach, take it with food. Take your medicine at regular intervals. Do not take it more often than directed. Talk to your pediatrician regarding the use of this medicine in  children. Special care may be needed. Patients over 49 years old may have a stronger reaction and need a smaller dose. Overdosage: If you think you have taken too much of this medicine contact a poison control center or emergency room at once. NOTE: This medicine is only for you. Do not share this medicine with others. What if I miss a dose? If you miss a dose, take it as soon as you can. If it is almost time for your next dose, take only that dose. Do not take double or extra doses. What may interact with this medicine? -alcohol -antihistamines -barbiturates like amobarbital, butalbital, butabarbital, methohexital, pentobarbital, phenobarbital, thiopental, and secobarbital -benztropine -drugs for bladder problems like solifenacin, trospium, oxybutynin, tolterodine, hyoscyamine, and methscopolamine -drugs for breathing problems like ipratropium and tiotropium -drugs for certain stomach or intestine problems like propantheline, homatropine methylbromide, glycopyrrolate, atropine, belladonna, and dicyclomine -general anesthetics like etomidate, ketamine, nitrous oxide, propofol, desflurane, enflurane, halothane, isoflurane, and sevoflurane -medicines for depression, anxiety, or psychotic disturbances -medicines for sleep -muscle relaxants -naltrexone -narcotic medicines (opiates) for pain -phenothiazines like perphenazine, thioridazine, chlorpromazine, mesoridazine, fluphenazine, prochlorperazine, promazine, and trifluoperazine -scopolamine -tramadol -trihexyphenidyl This list may not describe all possible interactions. Give your health care provider a list of all the medicines, herbs, non-prescription drugs, or dietary supplements you  use. Also tell them if you smoke, drink alcohol, or use illegal drugs. Some items may interact with your medicine. What should I watch for while using this medicine? Tell your doctor or health care professional if your pain does not go away, if it gets worse,  or if you have new or a different type of pain. You may develop tolerance to the medicine. Tolerance means that you will need a higher dose of the medication for pain relief. Tolerance is normal and is expected if you take this medicine for a long time. Do not suddenly stop taking your medicine because you may develop a severe reaction. Your body becomes used to the medicine. This does NOT mean you are addicted. Addiction is a behavior related to getting and using a drug for a non-medical reason. If you have pain, you have a medical reason to take pain medicine. Your doctor will tell you how much medicine to take. If your doctor wants you to stop the medicine, the dose will be slowly lowered over time to avoid any side effects. You may get drowsy or dizzy. Do not drive, use machinery, or do anything that needs mental alertness until you know how this medicine affects you. Do not stand or sit up quickly, especially if you are an older patient. This reduces the risk of dizzy or fainting spells. Alcohol may interfere with the effect of this medicine. Avoid alcoholic drinks. There are different types of narcotic medicines (opiates) for pain. If you take more than one type at the same time, you may have more side effects. Give your health care provider a list of all medicines you use. Your doctor will tell you how much medicine to take. Do not take more medicine than directed. Call emergency for help if you have problems breathing. The medicine will cause constipation. Try to have a bowel movement at least every 2 to 3 days. If you do not have a bowel movement for 3 days, call your doctor or health care professional. Do not take Tylenol (acetaminophen) or medicines that have acetaminophen with this medicine. Too much acetaminophen can be very dangerous. Many nonprescription medicines contain acetaminophen. Always read the labels carefully to avoid taking more acetaminophen. What side effects may I notice from  receiving this medicine? Side effects that you should report to your doctor or health care professional as soon as possible: -allergic reactions like skin rash, itching or hives, swelling of the face, lips, or tongue -breathing difficulties, wheezing -confusion -light headedness or fainting spells -severe stomach pain -unusually weak or tired -yellowing of the skin or the whites of the eyes Side effects that usually do not require medical attention (report to your doctor or health care professional if they continue or are bothersome): -dizziness -drowsiness -nausea -vomiting This list may not describe all possible side effects. Call your doctor for medical advice about side effects. You may report side effects to FDA at 1-800-FDA-1088. Where should I keep my medicine? Keep out of the reach of children. This medicine can be abused. Keep your medicine in a safe place to protect it from theft. Do not share this medicine with anyone. Selling or giving away this medicine is dangerous and against the law. This medicine may cause accidental overdose and death if it taken by other adults, children, or pets. Mix any unused medicine with a substance like cat litter or coffee grounds. Then throw the medicine away in a sealed container like a sealed bag or a coffee can  with a lid. Do not use the medicine after the expiration date. Store at room temperature between 20 and 25 degrees C (68 and 77 degrees F). NOTE: This sheet is a summary. It may not cover all possible information. If you have questions about this medicine, talk to your doctor, pharmacist, or health care provider.    2016, Elsevier/Gold Standard. (2014-09-15 15:18:46)  Tamsulosin capsules What is this medicine? TAMSULOSIN (tam SOO loe sin) is used to treat enlargement of the prostate gland in men, a condition called benign prostatic hyperplasia or BPH. It is not for use in women. It works by relaxing muscles in the prostate and bladder  neck. This improves urine flow and reduces BPH symptoms. This medicine may be used for other purposes; ask your health care provider or pharmacist if you have questions. What should I tell my health care provider before I take this medicine? They need to know if you have any of the following conditions: -advanced kidney disease -advanced liver disease -low blood pressure -prostate cancer -an unusual or allergic reaction to tamsulosin, sulfa drugs, other medicines, foods, dyes, or preservatives -pregnant or trying to get pregnant -breast-feeding How should I use this medicine? Take this medicine by mouth about 30 minutes after the same meal every day. Follow the directions on the prescription label. Swallow the capsules whole with a glass of water. Do not crush, chew, or open capsules. Do not take your medicine more often than directed. Do not stop taking your medicine unless your doctor tells you to. Talk to your pediatrician regarding the use of this medicine in children. Special care may be needed. Overdosage: If you think you have taken too much of this medicine contact a poison control center or emergency room at once. NOTE: This medicine is only for you. Do not share this medicine with others. What if I miss a dose? If you miss a dose, take it as soon as you can. If it is almost time for your next dose, take only that dose. Do not take double or extra doses. If you stop taking your medicine for several days or more, ask your doctor or health care professional what dose you should start back on. What may interact with this medicine? -cimetidine -fluoxetine -ketoconazole -medicines for erectile disfunction like sildenafil, tadalafil, vardenafil -medicines for high blood pressure -other alpha-blockers like alfuzosin, doxazosin, phentolamine, phenoxybenzamine, prazosin, terazosin -warfarin This list may not describe all possible interactions. Give your health care provider a list of all  the medicines, herbs, non-prescription drugs, or dietary supplements you use. Also tell them if you smoke, drink alcohol, or use illegal drugs. Some items may interact with your medicine. What should I watch for while using this medicine? Visit your doctor or health care professional for regular check ups. You will need lab work done before you start this medicine and regularly while you are taking it. Check your blood pressure as directed. Ask your health care professional what your blood pressure should be, and when you should contact him or her. This medicine may make you feel dizzy or lightheaded. This is more likely to happen after the first dose, after an increase in dose, or during hot weather or exercise. Drinking alcohol and taking some medicines can make this worse. Do not drive, use machinery, or do anything that needs mental alertness until you know how this medicine affects you. Do not sit or stand up quickly. If you begin to feel dizzy, sit down until you feel better.  These effects can decrease once your body adjusts to the medicine. Contact your doctor or health care professional right away if you have an erection that lasts longer than 4 hours or if it becomes painful. This may be a sign of a serious problem and must be treated right away to prevent permanent damage. If you are thinking of having cataract surgery, tell your eye surgeon that you have taken this medicine. What side effects may I notice from receiving this medicine? Side effects that you should report to your doctor or health care professional as soon as possible: -allergic reactions like skin rash or itching, hives, swelling of the lips, mouth, tongue, or throat -breathing problems -change in vision -feeling faint or lightheaded -irregular heartbeat -prolonged or painful erection -weakness Side effects that usually do not require medical attention (report to your doctor or health care professional if they continue or are  bothersome): -back pain -change in sex drive or performance -constipation, nausea or vomiting -cough -drowsy -runny or stuffy nose -trouble sleeping This list may not describe all possible side effects. Call your doctor for medical advice about side effects. You may report side effects to FDA at 1-800-FDA-1088. Where should I keep my medicine? Keep out of the reach of children. Store at room temperature between 15 and 30 degrees C (59 and 86 degrees F). Throw away any unused medicine after the expiration date. NOTE: This sheet is a summary. It may not cover all possible information. If you have questions about this medicine, talk to your doctor, pharmacist, or health care provider.    2016, Elsevier/Gold Standard. (2012-10-15 14:11:34)  Ondansetron tablets What is this medicine? ONDANSETRON (on DAN se tron) is used to treat nausea and vomiting caused by chemotherapy. It is also used to prevent or treat nausea and vomiting after surgery. This medicine may be used for other purposes; ask your health care provider or pharmacist if you have questions. What should I tell my health care provider before I take this medicine? They need to know if you have any of these conditions: -heart disease -history of irregular heartbeat -liver disease -low levels of magnesium or potassium in the blood -an unusual or allergic reaction to ondansetron, granisetron, other medicines, foods, dyes, or preservatives -pregnant or trying to get pregnant -breast-feeding How should I use this medicine? Take this medicine by mouth with a glass of water. Follow the directions on your prescription label. Take your doses at regular intervals. Do not take your medicine more often than directed. Talk to your pediatrician regarding the use of this medicine in children. Special care may be needed. Overdosage: If you think you have taken too much of this medicine contact a poison control center or emergency room at  once. NOTE: This medicine is only for you. Do not share this medicine with others. What if I miss a dose? If you miss a dose, take it as soon as you can. If it is almost time for your next dose, take only that dose. Do not take double or extra doses. What may interact with this medicine? Do not take this medicine with any of the following medications: -apomorphine -certain medicines for fungal infections like fluconazole, itraconazole, ketoconazole, posaconazole, voriconazole -cisapride -dofetilide -dronedarone -pimozide -thioridazine -ziprasidone This medicine may also interact with the following medications: -carbamazepine -certain medicines for depression, anxiety, or psychotic disturbances -fentanyl -linezolid -MAOIs like Carbex, Eldepryl, Marplan, Nardil, and Parnate -methylene blue (injected into a vein) -other medicines that prolong the QT interval (cause  an abnormal heart rhythm) -phenytoin -rifampicin -tramadol This list may not describe all possible interactions. Give your health care provider a list of all the medicines, herbs, non-prescription drugs, or dietary supplements you use. Also tell them if you smoke, drink alcohol, or use illegal drugs. Some items may interact with your medicine. What should I watch for while using this medicine? Check with your doctor or health care professional right away if you have any sign of an allergic reaction. What side effects may I notice from receiving this medicine? Side effects that you should report to your doctor or health care professional as soon as possible: -allergic reactions like skin rash, itching or hives, swelling of the face, lips or tongue -breathing problems -confusion -dizziness -fast or irregular heartbeat -feeling faint or lightheaded, falls -fever and chills -loss of balance or coordination -seizures -sweating -swelling of the hands or feet -tightness in the chest -tremors -unusually weak or tired Side  effects that usually do not require medical attention (report to your doctor or health care professional if they continue or are bothersome): -constipation or diarrhea -headache This list may not describe all possible side effects. Call your doctor for medical advice about side effects. You may report side effects to FDA at 1-800-FDA-1088. Where should I keep my medicine? Keep out of the reach of children. Store between 2 and 30 degrees C (36 and 86 degrees F). Throw away any unused medicine after the expiration date. NOTE: This sheet is a summary. It may not cover all possible information. If you have questions about this medicine, talk to your doctor, pharmacist, or health care provider.    2016, Elsevier/Gold Standard. (2013-07-22 16:27:45)

## 2015-09-27 ENCOUNTER — Other Ambulatory Visit: Payer: Self-pay | Admitting: Urology

## 2015-09-27 ENCOUNTER — Telehealth: Payer: Self-pay | Admitting: *Deleted

## 2015-09-27 ENCOUNTER — Telehealth: Payer: Self-pay | Admitting: Cardiovascular Disease

## 2015-09-27 NOTE — Telephone Encounter (Signed)
Requesting surgical clearance: [facility]  1. Type of surgery: cysto retrograde ureteroscopy holmium laser stone extraction/ JJ stent  2. Surgeon: Dr. Mena GoesEskridge  3. Surgical date: pending clearance  4. Medications that need to be held: Brilinta  For 5 days                         Special instructions:

## 2015-09-27 NOTE — Telephone Encounter (Signed)
Notice pulled, entered into computer for Dr Tresa Endokelly to review.

## 2015-09-27 NOTE — Telephone Encounter (Signed)
Waiting for clearance that was sent on 09-20-15. Please fax asap to [phone] Att:Pam

## 2015-09-28 ENCOUNTER — Telehealth: Payer: Self-pay | Admitting: Cardiovascular Disease

## 2015-09-28 NOTE — Telephone Encounter (Signed)
Returned call to patient's wife.She stated she was calling to see if Dr.Kelly cleared husband to have kidney stone surgery scheduled 10/04/15 by Dr.Eskridge.She wanted to know if ok to hold Brilinta 5 days prior to surgery.[facility] for advice.

## 2015-09-28 NOTE — Telephone Encounter (Signed)
Please call,they need his clearance asap. His surgery is scheduled for 10-04-15.They faxed this over on 09-19-15 and called yesterday.Please let her know what is going on.

## 2015-09-29 ENCOUNTER — Encounter (HOSPITAL_BASED_OUTPATIENT_CLINIC_OR_DEPARTMENT_OTHER): Payer: Self-pay | Admitting: *Deleted

## 2015-09-29 NOTE — Telephone Encounter (Signed)
Clearance routed to Dr. Jerilee FieldMatthew Eskridge- [facility].

## 2015-09-29 NOTE — Progress Notes (Addendum)
NPO AFTER MN.  ARRIVE AT 0830.  CURRENT LAB RESULTS AND EKG IN CHART AND EPIC.  WILL TAKE AM MEDS W/ SIPS OF WATER.   PT OK'D BY DR KELLY, CARDIOLOGIST, CAN STOP BRILINTA 5 DAYS PRIOR TO DOS.  LM FOR PT ON CELL PHONE , TODAY WILL BE LAST DAY TO TAKE BRILINTA. (PT WAS AWARE ABOUT WAITING FOR INSTRUCTIONS FROM HIS CARDIOLOGIST)

## 2015-09-29 NOTE — Telephone Encounter (Signed)
OK for surgery; hold brilinta for at least 5 days.

## 2015-09-29 NOTE — Progress Notes (Signed)
   09/29/15 1646  OBSTRUCTIVE SLEEP APNEA  Have you ever been diagnosed with sleep apnea through a sleep study? No  Do you snore loudly (loud enough to be heard through closed doors)?  1  Do you often feel tired, fatigued, or sleepy during the daytime (such as falling asleep during driving or talking to someone)? 0  Has anyone observed you stop breathing during your sleep? 1  Do you have, or are you being treated for high blood pressure? 1  BMI more than 35 kg/m2? 0  Age > 50 (1-yes) 0  Neck circumference greater than:Male 16 inches or larger, Male 17inches or larger? 1  Male Gender (Yes=1) 1  Obstructive Sleep Apnea Score 5  Score 5 or greater  Results sent to PCP

## 2015-10-03 NOTE — H&P (Signed)
History of Present Illness 09/12/15: The patient was seen in the emergency department earlier today with right flank pain. A CT scan revealed a 6 mm right proximal ureteral stone and an 8 mm right distal ureteral stone near the UVJ which may be 1 or 2 stones. The stones were visible on the scout image. He also had a 6 mm left kidney stone. His UA was clear, white count slightly elevated at 14.7, BUN 20 and creatinine slightly elevated at 1.52. He was discharged with pain medication and tamsulosin. Looking back at some flank pain 2 weeks ago which improved. Currently, pain 5 out of 10 which is improved compared to earlier. I reviewed the chart and he was not given Toradol. Pain in the right flank and right lower quadrant.    He has a significant coronary history with coronary stent 2 in 2014 on aspirin and Brilinta with recent cardiac perfusion imaging which was normal.     The patient had desired to try and pass the kidney stones on his own using medical expulsion therapy. He felt better after toradol so he was allowed to go home with one week f/u.    September 19, 2015: He continues on tamsulosin daily and Percocet for pain. He endorses not passing any lithiasis and intervals last being seen. He continues to have intermittent right-sided renal colic. There is also some associated increased frequency and urgency of urination. He denies fevers, gross hematuria, or dysuria. He is tolerating PO intake.   Past Medical History Problems  1. History of arthritis (Z87.39) 2. History of cardiac disorder (Z86.79) 3. History of gout (Z87.39) 4. History of hypercholesterolemia (Z86.39) 5. History of hypertension (Z86.79)  Surgical History Problems  1. History of Heart Surgery 2. History of Hernia Repair  Current Meds 1. Aspirin 81 MG TABS;  Therapy: (Recorded:14Nov2016) to Recorded 2. Atorvastatin Calcium 80 MG Oral Tablet;  Therapy: (Recorded:14Nov2016) to Recorded 3. Brilinta 60 MG Oral  Tablet;  Therapy: (Recorded:14Nov2016) to Recorded 4. Fenofibrate 160 MG Oral Tablet;  Therapy: (Recorded:14Nov2016) to Recorded 5. Hydrocodone-Acetaminophen TABS;  Therapy: (Recorded:14Nov2016) to Recorded 6. Isosorbide Dinitrate 30 MG Oral Tablet;  Therapy: (Recorded:14Nov2016) to Recorded 7. Losartan Potassium 50 MG Oral Tablet;  Therapy: (Recorded:14Nov2016) to Recorded 8. Metoprolol Tartrate 25 MG Oral Tablet;  Therapy: (Recorded:14Nov2016) to Recorded  Allergies Medication  1. No Known Drug Allergies  Family History Problems  1. Family history of Death of family member : Mother   age of death [id number] 2. Family history of cardiac disorder (Z82.49) : Mother, Father, Sister 3. Family history of myocardial infarction (Z82.49) : Mother  Social History Problems  1. Denied: History of Alcohol use 2. Caffeine use (F15.90)   1 per day 3. Married 4. Never a smoker 5. Number of children   1 daughter 6. Occupation   Counsellorprinter  Review of Systems Genitourinary, constitutional, skin, eye, otolaryngeal, hematologic/lymphatic, cardiovascular, pulmonary, endocrine, musculoskeletal, gastrointestinal, neurological and psychiatric system(s) were reviewed and pertinent findings if present are noted and are otherwise negative.  Genitourinary: urinary frequency, but no dysuria and no hematuria.  Gastrointestinal: flank pain and abdominal pain.  Constitutional: feeling poorly (malaise) and feeling tired (fatigue).  Musculoskeletal: back pain.    Physical Exam Constitutional: Well nourished and well developed . No acute distress.  ENT:. The ears and nose are normal in appearance.  Neck: The appearance of the neck is normal and no neck mass is present.  Pulmonary: No respiratory distress and normal respiratory rhythm and effort.  Cardiovascular: Heart  rate and rhythm are normal . No peripheral edema.  Abdomen: The abdomen is soft and nontender. No masses are palpated. No CVA tenderness. No  hernias are palpable. No hepatosplenomegaly noted.  Skin: Normal skin turgor, no visible rash and no visible skin lesions.  Neuro/Psych:. Mood and affect are appropriate.    Results/Data Urine [Data Includes: Last 1 Day]   21Nov2016  COLOR YELLOW   APPEARANCE CLEAR   SPECIFIC GRAVITY 1.020   pH 5.5   GLUCOSE NEGATIVE   BILIRUBIN NEGATIVE   KETONE NEGATIVE   BLOOD 1+   PROTEIN NEGATIVE   NITRITE NEGATIVE   LEUKOCYTE ESTERASE NEGATIVE   SQUAMOUS EPITHELIAL/HPF 0-5 HPF  WBC NONE SEEN WBC/HPF  RBC 3-10 RBC/HPF  BACTERIA NONE SEEN HPF  CRYSTALS NONE SEEN HPF  CASTS NONE SEEN LPF  Other    Yeast NONE SEEN HPF   The following images/tracing/specimen were independently visualized:  KUB demonstrates 3 right ureteral lithiasis with one being in the proximal ureter and to adjacent stones noted in the distal ureter was close proximity to the distal UVJ. There is also a noted upper pole calculus on the left kidney. Calculus visualized today is correlated with images seen on CT scan performed last monday.  The following clinical lab reports were reviewed:  3-10 red blood cells per high-power field noted on urinalysis. Sent for culture.    Assessment Assessed  1. Right ureteral stone (N20.1)  Plan Health Maintenance  1. UA With REFLEX; [Do Not Release]; Status:Complete;   Done: 21Nov2016 03:30PM Right ureteral stone  2. Start: Tamsulosin HCl - 0.4 MG Oral Capsule; TAKE 1 CAPSULE Daily 3. Start: TraMADol HCl - 50 MG Oral Tablet; TAKE 1 OR 2 TABLETS BY MOUTH EVERY 4 TO  6 HOURS AS NEEDED 4. URINE CULTURE; Status:Hold For - Specimen/Data Collection,Appointment; Requested  for:21Nov2016;  5. Follow-up Schedule Surgery Office  Follow-up  Status: Hold For - Appointment   Requested for: 21Nov2016  Discussion/Summary Continuation of symptoms and the obvious free calculus noted on KUB today on the right side, I recommend that he be scheduled for ureteroscopy. I then consulted with Dr. Mena Goes  and he is in agreement as well. I described the risks which include heart attack, stroke, pulmonary embolus, death, bleeding, infection, damage to contiguous structures, positioning injury, ureteral stricture, ureteral avulsion, ureteral injury, need for ureteral stent, inability to perform ureteroscopy, need for an interval procedure, inability to clear stone burden, stent discomfort and pain. He is on Brilinta and will need clearance from Dr Landry Dyke office to temporarily halt the medicine for his procedure. I refilled his tamsulosin and prescribed tramadol as well. Emergency department or in office follow-up given regarding any worsening of symptoms including gross hematuria, painful inability to void, or fever. He will follow up after procedure were we will then discuss kidney stone prevention strategies.     Signatures Electronically signed by : Anne Fu, Dyann Ruddle; Sep 19 2015  4:35PM EST   Add: Cleared for procedure with Dr. Tresa Endo. Urine cx negative.

## 2015-10-04 ENCOUNTER — Ambulatory Visit (HOSPITAL_BASED_OUTPATIENT_CLINIC_OR_DEPARTMENT_OTHER): Payer: BLUE CROSS/BLUE SHIELD | Admitting: Anesthesiology

## 2015-10-04 ENCOUNTER — Encounter (HOSPITAL_BASED_OUTPATIENT_CLINIC_OR_DEPARTMENT_OTHER): Payer: Self-pay | Admitting: *Deleted

## 2015-10-04 ENCOUNTER — Ambulatory Visit (HOSPITAL_BASED_OUTPATIENT_CLINIC_OR_DEPARTMENT_OTHER)
Admission: RE | Admit: 2015-10-04 | Discharge: 2015-10-04 | Disposition: A | Discharge status: Home / Self Care | Payer: BLUE CROSS/BLUE SHIELD | Source: Ambulatory Visit | Attending: Urology | Admitting: Urology

## 2015-10-04 ENCOUNTER — Encounter (HOSPITAL_BASED_OUTPATIENT_CLINIC_OR_DEPARTMENT_OTHER)
Admission: RE | Disposition: A | Discharge status: Home / Self Care | Payer: Self-pay | Source: Ambulatory Visit | Attending: Urology

## 2015-10-04 DIAGNOSIS — Z7902 Long term (current) use of antithrombotics/antiplatelets: Secondary | ICD-10-CM | POA: Diagnosis not present

## 2015-10-04 DIAGNOSIS — Z7982 Long term (current) use of aspirin: Secondary | ICD-10-CM | POA: Diagnosis not present

## 2015-10-04 DIAGNOSIS — Z79899 Other long term (current) drug therapy: Secondary | ICD-10-CM | POA: Insufficient documentation

## 2015-10-04 DIAGNOSIS — I2510 Atherosclerotic heart disease of native coronary artery without angina pectoris: Secondary | ICD-10-CM | POA: Diagnosis not present

## 2015-10-04 DIAGNOSIS — I10 Essential (primary) hypertension: Secondary | ICD-10-CM | POA: Diagnosis not present

## 2015-10-04 DIAGNOSIS — M1990 Unspecified osteoarthritis, unspecified site: Secondary | ICD-10-CM | POA: Insufficient documentation

## 2015-10-04 DIAGNOSIS — E7800 Pure hypercholesterolemia, unspecified: Secondary | ICD-10-CM | POA: Diagnosis not present

## 2015-10-04 DIAGNOSIS — N202 Calculus of kidney with calculus of ureter: Secondary | ICD-10-CM | POA: Insufficient documentation

## 2015-10-04 DIAGNOSIS — Z955 Presence of coronary angioplasty implant and graft: Secondary | ICD-10-CM | POA: Insufficient documentation

## 2015-10-04 DIAGNOSIS — Z79891 Long term (current) use of opiate analgesic: Secondary | ICD-10-CM | POA: Diagnosis not present

## 2015-10-04 DIAGNOSIS — M109 Gout, unspecified: Secondary | ICD-10-CM | POA: Insufficient documentation

## 2015-10-04 HISTORY — DX: Calculus of ureter: N20.1

## 2015-10-04 HISTORY — PX: CYSTOSCOPY WITH RETROGRADE PYELOGRAM, URETEROSCOPY AND STENT PLACEMENT: SHX5789

## 2015-10-04 HISTORY — DX: Presence of coronary angioplasty implant and graft: Z95.5

## 2015-10-04 HISTORY — DX: Personal history of other diseases of the musculoskeletal system and connective tissue: Z87.39

## 2015-10-04 HISTORY — PX: HOLMIUM LASER APPLICATION: SHX5852

## 2015-10-04 HISTORY — DX: Family history of ischemic heart disease and other diseases of the circulatory system: Z82.49

## 2015-10-04 HISTORY — DX: Other specified personal risk factors, not elsewhere classified: Z91.89

## 2015-10-04 SURGERY — CYSTOURETEROSCOPY, WITH RETROGRADE PYELOGRAM AND STENT INSERTION
Anesthesia: General | Site: Renal | Laterality: Right

## 2015-10-04 MED ORDER — CEFAZOLIN SODIUM-DEXTROSE 2-3 GM-% IV SOLR
INTRAVENOUS | Status: AC
  Filled 2015-10-04: qty 50

## 2015-10-04 MED ORDER — MIDAZOLAM HCL 5 MG/5ML IJ SOLN
INTRAMUSCULAR | Status: DC | PRN
  Administered 2015-10-04: 2 mg via INTRAVENOUS

## 2015-10-04 MED ORDER — PHENYLEPHRINE HCL 10 MG/ML IJ SOLN
INTRAMUSCULAR | Status: DC | PRN
  Administered 2015-10-04 (×2): 80 ug via INTRAVENOUS
  Administered 2015-10-04: 40 ug via INTRAVENOUS
  Administered 2015-10-04: 80 ug via INTRAVENOUS

## 2015-10-04 MED ORDER — FENTANYL CITRATE (PF) 100 MCG/2ML IJ SOLN
25.0000 ug | INTRAMUSCULAR | Status: DC | PRN
  Filled 2015-10-04: qty 1

## 2015-10-04 MED ORDER — DEXAMETHASONE SODIUM PHOSPHATE 4 MG/ML IJ SOLN
INTRAMUSCULAR | Status: DC | PRN
  Administered 2015-10-04: 10 mg via INTRAVENOUS

## 2015-10-04 MED ORDER — CEFAZOLIN SODIUM-DEXTROSE 2-3 GM-% IV SOLR
2.0000 g | INTRAVENOUS | Status: AC
  Administered 2015-10-04: 2 g via INTRAVENOUS
  Filled 2015-10-04: qty 50

## 2015-10-04 MED ORDER — LIDOCAINE HCL (CARDIAC) 20 MG/ML IV SOLN
INTRAVENOUS | Status: DC | PRN
  Administered 2015-10-04: 80 mg via INTRAVENOUS

## 2015-10-04 MED ORDER — ONDANSETRON HCL 4 MG/2ML IJ SOLN
INTRAMUSCULAR | Status: AC
  Filled 2015-10-04: qty 2

## 2015-10-04 MED ORDER — LACTATED RINGERS IV SOLN
INTRAVENOUS | Status: DC
  Administered 2015-10-04 (×2): via INTRAVENOUS
  Filled 2015-10-04: qty 1000

## 2015-10-04 MED ORDER — SODIUM CHLORIDE 0.9 % IR SOLN
Status: DC | PRN
  Administered 2015-10-04: 40000 mL

## 2015-10-04 MED ORDER — EPHEDRINE SULFATE 50 MG/ML IJ SOLN
INTRAMUSCULAR | Status: DC | PRN
  Administered 2015-10-04: 15 mg via INTRAVENOUS
  Administered 2015-10-04: 10 mg via INTRAVENOUS

## 2015-10-04 MED ORDER — KETOROLAC TROMETHAMINE 30 MG/ML IJ SOLN
INTRAMUSCULAR | Status: AC
  Filled 2015-10-04: qty 1

## 2015-10-04 MED ORDER — ONDANSETRON HCL 4 MG/2ML IJ SOLN
INTRAMUSCULAR | Status: DC | PRN
  Administered 2015-10-04: 4 mg via INTRAVENOUS

## 2015-10-04 MED ORDER — DEXAMETHASONE SODIUM PHOSPHATE 10 MG/ML IJ SOLN
INTRAMUSCULAR | Status: AC
  Filled 2015-10-04: qty 1

## 2015-10-04 MED ORDER — EPHEDRINE SULFATE 50 MG/ML IJ SOLN
INTRAMUSCULAR | Status: AC
  Filled 2015-10-04: qty 1

## 2015-10-04 MED ORDER — PROMETHAZINE HCL 25 MG/ML IJ SOLN
6.2500 mg | INTRAMUSCULAR | Status: DC | PRN
  Filled 2015-10-04: qty 1

## 2015-10-04 MED ORDER — PROPOFOL 10 MG/ML IV BOLUS
INTRAVENOUS | Status: DC | PRN
  Administered 2015-10-04: 100 mg via INTRAVENOUS
  Administered 2015-10-04: 200 mg via INTRAVENOUS

## 2015-10-04 MED ORDER — FENTANYL CITRATE (PF) 100 MCG/2ML IJ SOLN
INTRAMUSCULAR | Status: DC | PRN
  Administered 2015-10-04: 25 ug via INTRAVENOUS
  Administered 2015-10-04: 50 ug via INTRAVENOUS
  Administered 2015-10-04: 25 ug via INTRAVENOUS

## 2015-10-04 MED ORDER — FENTANYL CITRATE (PF) 100 MCG/2ML IJ SOLN
INTRAMUSCULAR | Status: AC
  Filled 2015-10-04: qty 4

## 2015-10-04 MED ORDER — LACTATED RINGERS IV SOLN
INTRAVENOUS | Status: DC
  Filled 2015-10-04: qty 1000

## 2015-10-04 MED ORDER — OXYCODONE-ACETAMINOPHEN 5-325 MG PO TABS: 1.0000 | ORAL_TABLET | Freq: Four times a day (QID) | ORAL | Status: DC | PRN

## 2015-10-04 MED ORDER — MEPERIDINE HCL 25 MG/ML IJ SOLN
6.2500 mg | INTRAMUSCULAR | Status: DC | PRN
  Filled 2015-10-04: qty 1

## 2015-10-04 MED ORDER — IOHEXOL 350 MG/ML SOLN
INTRAVENOUS | Status: DC | PRN
  Administered 2015-10-04: 8 mL

## 2015-10-04 MED ORDER — CEFAZOLIN SODIUM 1-5 GM-% IV SOLN
1.0000 g | INTRAVENOUS | Status: DC
  Filled 2015-10-04: qty 50

## 2015-10-04 MED ORDER — PROPOFOL 10 MG/ML IV BOLUS
INTRAVENOUS | Status: AC
  Filled 2015-10-04: qty 20

## 2015-10-04 MED ORDER — MIDAZOLAM HCL 2 MG/2ML IJ SOLN
INTRAMUSCULAR | Status: AC
  Filled 2015-10-04: qty 2

## 2015-10-04 SURGICAL SUPPLY — 48 items
ADAPTER CATH URET PLST 4-6FR (CATHETERS) IMPLANT
ADPR CATH URET STRL DISP 4-6FR (CATHETERS)
BAG DRAIN URO-CYSTO SKYTR STRL (DRAIN) ×3 IMPLANT
BAG DRN UROCATH (DRAIN) ×1
BASKET LASER NITINOL 1.9FR (BASKET) ×2 IMPLANT
BASKET STNLS GEMINI 4WIRE 3FR (BASKET) IMPLANT
BASKET ZERO TIP NITINOL 2.4FR (BASKET) IMPLANT
BSKT STON RTRVL 120 1.9FR (BASKET) ×1
BSKT STON RTRVL GEM 120X11 3FR (BASKET)
BSKT STON RTRVL ZERO TP 2.4FR (BASKET)
CANISTER SUCT LVC 12 LTR MEDI- (MISCELLANEOUS) IMPLANT
CATH INTERMIT  6FR 70CM (CATHETERS) IMPLANT
CATH URET 5FR 28IN CONE TIP (BALLOONS)
CATH URET 5FR 28IN OPEN ENDED (CATHETERS) IMPLANT
CATH URET 5FR 70CM CONE TIP (BALLOONS) IMPLANT
CATH URET DUAL LUMEN 6-10FR 50 (CATHETERS) IMPLANT
CLOTH BEACON ORANGE TIMEOUT ST (SAFETY) ×3 IMPLANT
ELECT REM PT RETURN 9FT ADLT (ELECTROSURGICAL)
ELECTRODE REM PT RTRN 9FT ADLT (ELECTROSURGICAL) IMPLANT
FIBER LASER FLEXIVA 200 (UROLOGICAL SUPPLIES) IMPLANT
FIBER LASER FLEXIVA 365 (UROLOGICAL SUPPLIES) IMPLANT
FIBER LASER TRAC TIP (UROLOGICAL SUPPLIES) ×2 IMPLANT
GLOVE BIO SURGEON STRL SZ 6.5 (GLOVE) ×1 IMPLANT
GLOVE BIO SURGEON STRL SZ7.5 (GLOVE) ×3 IMPLANT
GLOVE BIO SURGEONS STRL SZ 6.5 (GLOVE) ×1
GLOVE BIOGEL PI IND STRL 6.5 (GLOVE) IMPLANT
GLOVE BIOGEL PI INDICATOR 6.5 (GLOVE) ×4
GOWN STRL REUS W/ TWL LRG LVL3 (GOWN DISPOSABLE) ×1 IMPLANT
GOWN STRL REUS W/ TWL XL LVL3 (GOWN DISPOSABLE) ×1 IMPLANT
GOWN STRL REUS W/TWL LRG LVL3 (GOWN DISPOSABLE) ×3
GOWN STRL REUS W/TWL XL LVL3 (GOWN DISPOSABLE) ×3
GUIDEWIRE 0.038 PTFE COATED (WIRE) ×3 IMPLANT
GUIDEWIRE ANG ZIPWIRE 038X150 (WIRE) IMPLANT
GUIDEWIRE STR DUAL SENSOR (WIRE) ×2 IMPLANT
IV NS IRRIG 3000ML ARTHROMATIC (IV SOLUTION) ×6 IMPLANT
KIT BALLIN UROMAX 15FX10 (LABEL) IMPLANT
KIT BALLN UROMAX 15FX4 (MISCELLANEOUS) IMPLANT
KIT BALLN UROMAX 26 75X4 (MISCELLANEOUS)
KIT ROOM TURNOVER WOR (KITS) ×3 IMPLANT
MANIFOLD NEPTUNE II (INSTRUMENTS) IMPLANT
PACK CYSTO (CUSTOM PROCEDURE TRAY) ×3 IMPLANT
SET HIGH PRES BAL DIL (LABEL)
SHEATH ACCESS URETERAL 38CM (SHEATH) ×2 IMPLANT
STENT URET 6FRX26 CONTOUR (STENTS) ×2 IMPLANT
SURGILUBE 2OZ TUBE FLIPTOP (MISCELLANEOUS) ×3 IMPLANT
SYRINGE IRR TOOMEY STRL 70CC (SYRINGE) IMPLANT
TUBE CONNECTING 12'X1/4 (SUCTIONS)
TUBE CONNECTING 12X1/4 (SUCTIONS) IMPLANT

## 2015-10-04 NOTE — Interval H&P Note (Signed)
History and Physical Interval Note:  10/04/2015 10:15 AM  Gregory Nixon  has presented today for surgery, with the diagnosis of RIGHT URETERAL STONE  The various methods of treatment have been discussed with the patient and family. After consideration of risks, benefits and other options for treatment, the patient has consented to  Procedure(s): CYSTOSCOPY WITH RETROGRADE PYELOGRAM WITH INTERPERTATION RIGHT URETEROSCOPY  BASKET EXTRACTION DOUBLE J STENT (Right) as a surgical intervention .  The patient's history has been reviewed, patient examined, no change in status, stable for surgery.  I have reviewed the patient's chart and labs.  Questions were answered to the patient's satisfaction.  Pt has not passed a stone. Continues to have some mild right flank pain. No dysuria or gross hematuria. No fever.    Makarios Madlock

## 2015-10-04 NOTE — Transfer of Care (Signed)
Immediate Anesthesia Transfer of Care Note  Patient: Gregory Nixon  Procedure(s) Performed: Procedure(s) (LRB): CYSTOSCOPY WITH RETROGRADE PYELOGRAM WITH INTERPERTATION RIGHT URETEROSCOPY  BASKET EXTRACTION DOUBLE J STENT (Right) HOLMIUM LASER APPLICATION (Right)  Patient Location: PACU  Anesthesia Type: General  Level of Consciousness: awake, sedated, patient cooperative and responds to stimulation  Airway & Oxygen Therapy: Patient Spontanous Breathing and Patient connected to face mask oxygen  Post-op Assessment: Report given to PACU RN, Post -op Vital signs reviewed and stable and Patient moving all extremities  Post vital signs: Reviewed and stable  Complications: No apparent anesthesia complications

## 2015-10-04 NOTE — Discharge Instructions (Signed)
[facility] [phone] Post Ureteroscopy With Stent Instructions  Definitions:  Ureter: The duct that transports urine from the kidney to the bladder. Stent:   A plastic hollow tube that is placed into the ureter, from the kidney to the bladder to prevent the ureter from swelling shut.  GENERAL INSTRUCTIONS:  Despite the fact that no skin incisions were used, the area around the ureter and bladder is raw and irritated. The stent is a foreign body which will further irritate the bladder wall. This irritation is manifested by increased frequency of urination, both day and night, and by an increase in the urge to urinate. In some, the urge to urinate is present almost always. Sometimes the urge is strong enough that you may not be able to stop yourself from urinating. The only real cure is to remove the stent and then give time for the bladder wall to heal which can't be done until the danger of the ureter swelling shut has passed, which varies.  You may see some blood in your urine while the stent is in place and a few days afterwards. Do not be alarmed, even if the urine was clear for a while. Get off your feet and drink lots of fluids until clearing occurs. If you start to pass clots or don't improve, call us.  REMOVAL OF THE STENT: Remove the stent by pulling the string with slow steady pressure on Friday morning, 10/07/2015, until the entire stent is removed.   DIET: You may return to your normal diet immediately. Because of the raw surface of your bladder, alcohol, spicy foods, acid type foods and drinks with caffeine may cause irritation or frequency and should be used in moderation. To keep your urine flowing freely and to avoid constipation, drink plenty of fluids during the day ( 8-10 glasses ). Tip: Avoid cranberry juice because it is very acidic.  ACTIVITY: Your physical activity doesn't need to be restricted. However, if you are very active, you may see some blood  in your urine. We suggest that you reduce your activity under these circumstances until the bleeding has stopped.  BOWELS: It is important to keep your bowels regular during the postoperative period. Straining with bowel movements can cause bleeding. A bowel movement every other day is reasonable. Use a mild laxative if needed, such as Milk of Magnesia 2-3 tablespoons, or 2 Dulcolax tablets. Call if you continue to have problems. If you have been taking narcotics for pain, before, during or after your surgery, you may be constipated. Take a laxative if necessary.   MEDICATION: You should resume your pre-surgery medications unless told not to. In addition you will often be given an antibiotic to prevent infection. These should be taken as prescribed until the bottles are finished unless you are having an unusual reaction to one of the drugs.  PROBLEMS YOU SHOULD REPORT TO US:  Fevers over 100.5 Fahrenheit.  Heavy bleeding, or clots ( See above notes about blood in urine ).  Inability to urinate.  Drug reactions ( hives, rash, nausea, vomiting, diarrhea ).  Severe burning or pain with urination that is not improving.  FOLLOW-UP: You will need a follow-up appointment to monitor your progress. You will be scheduled in 6 weeks for follow-up with a right renal ultrasound to ensure you are healing properly.   Post Anesthesia Home Care Instructions  Activity: Get plenty of rest for the remainder of the day. A responsible adult should stay with you for 24 hours following  the procedure.  For the next 24 hours, DO NOT: -Drive a car -Advertising copywriter -Drink alcoholic beverages -Take any medication unless instructed by your physician -Make any legal decisions or sign important papers.  Meals: Start with liquid foods such as gelatin or soup. Progress to regular foods as tolerated. Avoid greasy, spicy, heavy foods. If nausea and/or vomiting occur, drink only clear liquids until the nausea  and/or vomiting subsides. Call your physician if vomiting continues.  Special Instructions/Symptoms: Your throat may feel dry or sore from the anesthesia or the breathing tube placed in your throat during surgery. If this causes discomfort, gargle with warm salt water. The discomfort should disappear within 24 hours.  If you had a scopolamine patch placed behind your ear for the management of post- operative nausea and/or vomiting:  1. The medication in the patch is effective for 72 hours, after which it should be removed.  Wrap patch in a tissue and discard in the trash. Wash hands thoroughly with soap and water. 2. You may remove the patch earlier than 72 hours if you experience unpleasant side effects which may include dry mouth, dizziness or visual disturbances. 3. Avoid touching the patch. Wash your hands with soap and water after contact with the patch.

## 2015-10-04 NOTE — Anesthesia Preprocedure Evaluation (Addendum)
Anesthesia Evaluation  Patient identified by MRN, date of birth, ID band Patient awake    Reviewed: Allergy & Precautions, NPO status , Patient's Chart, lab work & pertinent test results  Airway Mallampati: II  TM Distance: >3 FB Neck ROM: Full    Dental no notable dental hx. (+) Teeth Intact, Dental Advisory Given   Pulmonary neg pulmonary ROS,    Pulmonary exam normal breath sounds clear to auscultation       Cardiovascular hypertension, Pt. on medications + CAD and + Cardiac Stents (2014)  Normal cardiovascular exam Rhythm:Regular Rate:Normal     Neuro/Psych negative neurological ROS  negative psych ROS   GI/Hepatic negative GI ROS, Neg liver ROS,   Endo/Other  negative endocrine ROS  Renal/GU negative Renal ROS  negative genitourinary   Musculoskeletal negative musculoskeletal ROS (+) Arthritis ,   Abdominal   Peds negative pediatric ROS (+)  Hematology negative hematology ROS (+)   Anesthesia Other Findings   Reproductive/Obstetrics negative OB ROS                          Anesthesia Physical Anesthesia Plan  ASA: III  Anesthesia Plan: General   Post-op Pain Management:    Induction: Intravenous  Airway Management Planned: LMA  Additional Equipment:   Intra-op Plan:   Post-operative Plan: Extubation in OR  Informed Consent: I have reviewed the patients History and Physical, chart, labs and discussed the procedure including the risks, benefits and alternatives for the proposed anesthesia with the patient or authorized representative who has indicated his/her understanding and acceptance.   Dental advisory given  Plan Discussed with: CRNA  Anesthesia Plan Comments:         Anesthesia Quick Evaluation

## 2015-10-04 NOTE — Anesthesia Procedure Notes (Signed)
Procedure Name: LMA Insertion Date/Time: 10/04/2015 10:46 AM Performed by: Tyrone NineSAUVE, Landry Kamath F Pre-anesthesia Checklist: Patient identified, Timeout performed, Emergency Drugs available, Suction available and Patient being monitored Patient Re-evaluated:Patient Re-evaluated prior to inductionOxygen Delivery Method: Circle system utilized Preoxygenation: Pre-oxygenation with 100% oxygen Intubation Type: IV induction Ventilation: Mask ventilation without difficulty LMA: LMA inserted LMA Size: 4.0 Number of attempts: 2 Placement Confirmation: positive ETCO2 and breath sounds checked- equal and bilateral Tube secured with: Tape Dental Injury: Teeth and Oropharynx as per pre-operative assessment

## 2015-10-04 NOTE — Anesthesia Postprocedure Evaluation (Signed)
Anesthesia Post Note  Patient: Gregory Nixon  Procedure(s) Performed: Procedure(s) (LRB): CYSTOSCOPY WITH RETROGRADE PYELOGRAM WITH INTERPERTATION RIGHT URETEROSCOPY  BASKET EXTRACTION DOUBLE J STENT (Right) HOLMIUM LASER APPLICATION (Right)  Patient location during evaluation: PACU Anesthesia Type: General Level of consciousness: awake and alert Pain management: pain level controlled Vital Signs Assessment: post-procedure vital signs reviewed and stable Respiratory status: spontaneous breathing, nonlabored ventilation, respiratory function stable and patient connected to nasal cannula oxygen Cardiovascular status: blood pressure returned to baseline and stable Postop Assessment: no signs of nausea or vomiting Anesthetic complications: no    Last Vitals:  Filed Vitals:   10/04/15 1200 10/04/15 1210  BP: 110/68 104/72  Pulse: 81 87  Temp:    Resp: 17 18    Last Pain:  Filed Vitals:   10/04/15 1212  PainSc: Asleep                 Phillips Groutarignan, Katiya Fike

## 2015-10-04 NOTE — Op Note (Signed)
Preoperative diagnosis: Right ureteral stones, right renal stone Postoperative diagnosis: Same  Procedure: Cystoscopy, right retrograde pyelogram, right ureteroscopy with holmium laser lithotripsy stone basket extraction and stent placement  Surgeon: Mena GoesEskridge  Anesthesia: Gen.  Indication for procedure: 49 year old with symptomatic right ureteral stones. Failure to progress.  Findings: On exam under anesthesia the penis was circumcised without mass or lesion, the testicles were descended bilaterally and palpably normal and on digital rectal exam the prostate was normal size and smooth without hard area or nodule.  On cystoscopy the urethra, prostatic urethra and bladder were unremarkable. On right ureteroscopy 3 stones were noted stacked up in the distal ureter and the stone was located in the right lower pole, the right proximal stone I believe it drop-down into the distal ureter.  Right retrograde pyelogram-this outlined several filling defects in the distal ureter consistent with the stones with mild proximal dilation of the ureter and collecting system without other filling defect. I did not appreciate the proximal ureteral stone on the retrograde or scout.  Description of procedure: After consent was obtained patient brought the operating room. After adequate anesthesia he is placed in lithotomy position and prepped and draped in the usual sterile fashion. A timeout was performed to confirm the patient and procedure. The cystoscope was passed per urethra and the bladder inspected. There were no stones or foreign bodies in the bladder. The right retrograde was obtained by cannulating the right ureteral orifice with a 6 [place] open-ended catheter. Retrograde injection of contrast was performed. A sensor wire was advanced and cold in the kidney. The inner cannula of the access sheath was used to dilate the intraluminal ureter and then a semirigid ureteroscope was advanced were 3 stones were noted  in the distal ureter there were fragmented at a setting of 0.2 and 50 with the holmium laser. Pieces were sequentially brought out and dropped in the bladder with an escape basket. The semirigid was easily passed up into the proximal ureter are no other stones were noted and a second wire was passed under direct vision. Over the second wire the access sheath was advanced without difficulty and the digital ureteroscope was advanced. The collecting system was inspected and only a lower pole stone was noted as seen on the CT. This was grasped with the escape basket and removed. I then reinspected the upper middle and lower pole calyces and noted no other stones. The renal pelvis was inspected and then I inspected the ureter as the digital ureteroscope and access sheath were brought out together. The ureter was normal without stone fragment or injury. The cystoscope was passed per urethra and the stone fragments drain from the bladder. The wire was backloaded on the cystoscope and a 6 x 26 cm stent was advanced. The wire was removed with good coil seen in the kidney and a good coil in the bladder. The scope was removed and the patient was awakened and taken to the recovery room in stable condition.  Complications: None  Blood loss: Minimal  Specimens: Stone fragments to office lab  Drains: 6 x 26 cm right ureteral stent with string

## 2015-10-05 ENCOUNTER — Encounter (HOSPITAL_BASED_OUTPATIENT_CLINIC_OR_DEPARTMENT_OTHER): Payer: Self-pay | Admitting: Urology

## 2015-10-11 ENCOUNTER — Encounter (HOSPITAL_BASED_OUTPATIENT_CLINIC_OR_DEPARTMENT_OTHER): Payer: Self-pay | Admitting: Urology

## 2015-11-28 ENCOUNTER — Other Ambulatory Visit: Payer: Self-pay | Admitting: Cardiovascular Disease

## 2015-11-28 NOTE — Telephone Encounter (Signed)
REFILL 

## 2016-02-03 ENCOUNTER — Other Ambulatory Visit: Payer: Self-pay | Admitting: Cardiovascular Disease

## 2016-03-09 DIAGNOSIS — E7800 Pure hypercholesterolemia, unspecified: Secondary | ICD-10-CM | POA: Diagnosis not present

## 2016-03-09 DIAGNOSIS — Z0000 Encounter for general adult medical examination without abnormal findings: Secondary | ICD-10-CM | POA: Diagnosis not present

## 2016-03-09 DIAGNOSIS — Z125 Encounter for screening for malignant neoplasm of prostate: Secondary | ICD-10-CM | POA: Diagnosis not present

## 2016-03-09 DIAGNOSIS — Z1211 Encounter for screening for malignant neoplasm of colon: Secondary | ICD-10-CM | POA: Diagnosis not present

## 2016-03-09 DIAGNOSIS — I119 Hypertensive heart disease without heart failure: Secondary | ICD-10-CM | POA: Diagnosis not present

## 2016-03-19 ENCOUNTER — Other Ambulatory Visit: Payer: Self-pay

## 2016-03-19 MED ORDER — EZETIMIBE 10 MG PO TABS: 10.0000 mg | ORAL_TABLET | Freq: Every day | ORAL | Status: DC

## 2016-04-11 ENCOUNTER — Other Ambulatory Visit: Payer: Self-pay | Admitting: Cardiovascular Disease

## 2016-04-11 DIAGNOSIS — Z01818 Encounter for other preprocedural examination: Secondary | ICD-10-CM | POA: Diagnosis not present

## 2016-04-20 ENCOUNTER — Other Ambulatory Visit: Payer: Self-pay | Admitting: Cardiovascular Disease

## 2016-04-20 ENCOUNTER — Telehealth: Payer: Self-pay | Admitting: *Deleted

## 2016-04-20 NOTE — Telephone Encounter (Signed)
  Requesting surgical clearance:   1. Type of surgery: Colonoscopy  2. Surgeon: Dr Herbert MoorsSalem Ganem  3. Surgical date: 05/24/16  4. Medications that need to be held: Brilinta                       Special instructions:

## 2016-04-22 NOTE — Telephone Encounter (Signed)
Will need to hold Brilinta for 5 days prior to procedure.  The patient is on ASA 81 mg.

## 2016-04-24 NOTE — Telephone Encounter (Signed)
Faxed colonoscopy clearance to Dr [facility]em [facility]

## 2016-05-24 DIAGNOSIS — D122 Benign neoplasm of ascending colon: Secondary | ICD-10-CM | POA: Diagnosis not present

## 2016-05-24 DIAGNOSIS — Z1211 Encounter for screening for malignant neoplasm of colon: Secondary | ICD-10-CM | POA: Diagnosis not present

## 2016-05-24 DIAGNOSIS — D125 Benign neoplasm of sigmoid colon: Secondary | ICD-10-CM | POA: Diagnosis not present

## 2016-05-24 DIAGNOSIS — D126 Benign neoplasm of colon, unspecified: Secondary | ICD-10-CM | POA: Diagnosis not present

## 2016-06-15 ENCOUNTER — Other Ambulatory Visit: Payer: Self-pay | Admitting: Cardiovascular Disease

## 2016-06-15 NOTE — Telephone Encounter (Signed)
Rx request sent to pharmacy.  

## 2016-08-22 ENCOUNTER — Other Ambulatory Visit: Payer: Self-pay | Admitting: Cardiovascular Disease

## 2016-08-22 NOTE — Telephone Encounter (Signed)
Rx(s) sent to pharmacy electronically.  

## 2016-09-07 ENCOUNTER — Other Ambulatory Visit: Payer: Self-pay | Admitting: Cardiovascular Disease

## 2016-09-13 ENCOUNTER — Other Ambulatory Visit: Payer: Self-pay | Admitting: Cardiovascular Disease

## 2016-10-09 ENCOUNTER — Other Ambulatory Visit: Payer: Self-pay | Admitting: Cardiovascular Disease

## 2016-10-09 NOTE — Telephone Encounter (Signed)
REFILL 

## 2016-11-19 ENCOUNTER — Other Ambulatory Visit: Payer: Self-pay | Admitting: Cardiovascular Disease

## 2016-11-27 ENCOUNTER — Encounter: Payer: Self-pay | Admitting: Cardiovascular Disease

## 2016-11-27 ENCOUNTER — Ambulatory Visit (INDEPENDENT_AMBULATORY_CARE_PROVIDER_SITE_OTHER): Payer: BLUE CROSS/BLUE SHIELD | Admitting: Cardiovascular Disease

## 2016-11-27 VITALS — BP 116/74 | HR 65 | Ht 68.0 in | Wt 209.0 lb

## 2016-11-27 DIAGNOSIS — Z79899 Other long term (current) drug therapy: Secondary | ICD-10-CM | POA: Diagnosis not present

## 2016-11-27 DIAGNOSIS — E785 Hyperlipidemia, unspecified: Secondary | ICD-10-CM | POA: Diagnosis not present

## 2016-11-27 DIAGNOSIS — I2510 Atherosclerotic heart disease of native coronary artery without angina pectoris: Secondary | ICD-10-CM | POA: Diagnosis not present

## 2016-11-27 DIAGNOSIS — I10 Essential (primary) hypertension: Secondary | ICD-10-CM | POA: Diagnosis not present

## 2016-11-27 DIAGNOSIS — E669 Obesity, unspecified: Secondary | ICD-10-CM

## 2016-11-27 DIAGNOSIS — Z87442 Personal history of urinary calculi: Secondary | ICD-10-CM

## 2016-11-27 NOTE — Progress Notes (Signed)
Patient ID: Gregory MROCZKA, male   DOB: 21-Sep-1966, 51 y.o.   MRN: [id number]      HPI: [name] is a 51 year old male  presents to the office for a 72-monthfollow-up cardiology evaluation.   Mr. FLabargehas a strong family history for CAD in both his mother who died at age 6829with a massive heart attack and his father who is status post CABG revascularization surgery. His sister  has had multiple stents placed in her coronary arteries. The patient has a several year history of hypertension as well as hyperlipidemia.  In October 2014 he developed progressive exertional substernal chest tightness associated with left arm radiation and exertionally precipitated shortness of breath.  He saw Dr. EMarisue Humbleand was prescribed isosorbide 30 mg. I saw him for initial evaluation on 08/18/2013 and was concerned that his symptoms represented progressive accelerated angina pectoris. I added beta blocker and further titrated his isosorbide therapy. Cardiac catheterization on 08/24/2013 via the right radial artery approach revealed severe three-vessel coronary artery disease. His LAD had mild 30% narrowing before the diagonal vessel but after the diagonal vessel the LAD had a 99% eccentric focal stenosis. The proximal circumflex artery had an 80% smooth focal eccentric stenosis before the takeoff of the first marginal branch and there was a 50% stenosis in the midportion of the marginal vessel. The RCA was diffusely diseased and the mid RCA was completely occluded. There were bridging collaterals extending to the PDA. The distal RCA was occluded after the PDA takeoff and he did have some distal collateralization via left coronary circulation. Ejection fraction on ventriculography was 55%. I performed successful two-vessel coronary intervention and stent to the 99% mid LAD stenosis with a 2.75x12 mm Promus premier DES stent and the left circumflex coronary artery stenosis with a 3.5x15 mm sites expedition DES stent.  He was discharged the following day on increased medical regimen.  Since his coronary intervention he denies any significant recurrent chest pain episodes.  He has  been on a medical regimen consisting of isosorbide 30 mg daily, Lopressor 20 50 g twice a day, and losartan 50 mg both her blood pressure and his CAD.  He has been aggressively treated with lipid therapy on atorvastatin 80 mg, Zetia 10 mg, and fenofibrate 160 mg daily.  He is on  dual antiplatelet therapy with aspirin and Brilinta at reduced dose per the Pegasus trial.  He works at a[employer]and walks.  He underwent a nuclear perfusion study on 06/22/2015.  This was normal and revealed normal perfusion without evidence for prior infarction or evidence for ischemia.  Ejection fraction was 57% and he had normal wall motion.   Since I last saw him, he had issues with kidney stones and underwent cystoscopy with retrograde pyelogram with right ureteroscopic, basket extraction and double-J stent  inDecember 2016.  He has not been successful with weight loss.  He does not routinely exercise.  He denies any episodes of chest pain or palpitations.  He is unaware of any presyncope or syncope.  He presents for follow-up evaluation.  Past Medical History:  Diagnosis Date  . Arthritis    hands  . At risk for sleep apnea    STOP-BANG= 5     SENT TO PCP 09-29-2015  . Coronary artery disease cardiologist-  dr tShelva Majestic  08-24-2013 s/p  PCI and DES x2  to LAD and pCFX    . Family history of premature CAD   . History  of gout    lower extremities-- per pt no issues since 2014  . Hypertension   . Right ureteral stone   . S/P drug eluting coronary stent placement    08-24-2013  to LAD and pCFX    Past Surgical History:  Procedure Laterality Date  . CARDIOVASCULAR STRESS TEST  06-21-2015   dr  Shelva Majestic   normal nuclear study/  no ischemia or scar/ normal LV function and wall motion , ef 57%  . CYSTECTOMY  age 55   left leg  .  CYSTOSCOPY WITH RETROGRADE PYELOGRAM, URETEROSCOPY AND STENT PLACEMENT Right 10/04/2015   Procedure: CYSTOSCOPY WITH RETROGRADE PYELOGRAM WITH INTERPERTATION RIGHT URETEROSCOPY  BASKET EXTRACTION DOUBLE J STENT;  Surgeon: Festus Aloe, MD;  Location: [facility];  Service: Urology;  Laterality: Right;  . HOLMIUM LASER APPLICATION Right 47/05/2955   Procedure: HOLMIUM LASER APPLICATION;  Surgeon: Festus Aloe, MD;  Location: [facility];  Service: Urology;  Laterality: Right;  . INGUINAL HERNIA REPAIR  09/04/2011   Procedure: LAPAROSCOPIC INGUINAL HERNIA;  Surgeon: Pedro Earls, MD;  Location: [facility];  Service: General;  Laterality: Right;  . INGUINAL HERNIA REPAIR  09/04/2011   Procedure: HERNIA REPAIR INGUINAL ADULT;  Surgeon: Pedro Earls, MD;  Location: [facility];  Service: General;  Laterality: N/A;  . LEFT AND RIGHT HEART CATHETERIZATION WITH CORONARY ANGIOGRAM N/A 08/24/2013   Procedure: LEFT AND RIGHT HEART CATHETERIZATION WITH CORONARY ANGIOGRAM;  Surgeon: Troy Sine, MD;  Location: [facility] C[facility];  Service: Cardiovascular;  Laterality: N/A;  . PERCUTANEOUS CORONARY STENT INTERVENTION (PCI-S) N/A 08/24/2013   Procedure: PERCUTANEOUS CORONARY STENT INTERVENTION (PCI-S);  Surgeon: Troy Sine, MD;  Location: [facility] CATH LAB;  Service: Cardiovascular;  Laterality: N/A;   Severe 3 vessel CAD/  Total occlusion mRCA and dRCA with collaterals ,  DEStenting to LAD and pCFX,  ef 55%  . REPAIR RIGHT SCROTAL HERNIA  05-30-2011    No Known Allergies  Current Outpatient Prescriptions  Medication Sig Dispense Refill  . aspirin EC 81 MG tablet Take 81 mg by mouth daily.    Marland Kitchen atorvastatin (LIPITOR) 80 MG tablet Take 1 tablet (80 mg total) by mouth daily at 6 PM. (Patient taking differently: Take 80 mg by mouth every morning. ) 30 tablet 5  . BRILINTA 60 MG TABS tablet TAKE 1 TABLET BY MOUTH TWICE DAILY 60 tablet 4  . ezetimibe (ZETIA) 10 MG tablet Take 1 tablet  (10 mg total) by mouth daily. 30 tablet 6  . fenofibrate 160 MG tablet Take 160 mg by mouth every morning.     Marland Kitchen ibuprofen (ADVIL,MOTRIN) 200 MG tablet Take 400 mg by mouth every 6 (six) hours as needed (for pain.).    Marland Kitchen isosorbide mononitrate (IMDUR) 30 MG 24 hr tablet TAKE 1 TABLET (30 MG TOTAL) BY MOUTH DAILY. 30 tablet 3  . losartan (COZAAR) 50 MG tablet Take 50 mg by mouth every morning.     . metoprolol tartrate (LOPRESSOR) 25 MG tablet TAKE 1 TABLET (25 MG TOTAL) BY MOUTH 2 (TWO) TIMES DAILY. NEED OV. 60 tablet 0  . NITROSTAT 0.4 MG SL tablet PLACE 1 TABLET UNDER TONGUE EVERY 5 MINUTES ASNEEDED FOR CHEST PAIN. MAX 3 DOSES. 25 tablet 3  . ondansetron (ZOFRAN) 4 MG tablet Take 1 tablet (4 mg total) by mouth every 6 (six) hours as needed for nausea or vomiting. 12 tablet 0  . oxyCODONE-acetaminophen (ROXICET) 5-325 MG tablet Take 1-2 tablets by  mouth every 6 (six) hours as needed for severe pain. 20 tablet 0  . tamsulosin (FLOMAX) 0.4 MG CAPS capsule Take 1 capsule (0.4 mg total) by mouth daily. (Patient taking differently: Take 0.4 mg by mouth daily after breakfast. ) 10 capsule 0  . traMADol (ULTRAM) 50 MG tablet Take 50 mg by mouth every 6 (six) hours as needed.     No current facility-administered medications for this visit.     Social history is notable in that he is married for 9 years. He works as a Presenter, broadcasting. He completed 12th grade of education. He has one child age 35-2 does not routinely exercise but previously had been trying to walk intermittently. There is no tobacco history. He seldom drinks beer or wine.  Family History  Problem Relation Age of Onset  . Heart disease Mother   . Heart disease Father   . Thyroid disease Father   . Heart disease Sister   . Thyroid disease Sister   . Diabetes Sister     Since I last saw him, his father passed away 1 year ago .  One week after PVD intervention.  He continues to smoke cigarettes.  ROS General: Negative; No  fevers, chills, or night sweats; positive for mild obesity HEENT: Negative; No changes in vision or hearing, sinus congestion, difficulty swallowing Pulmonary: Negative; No cough, wheezing, shortness of breath, hemoptysis Cardiovascular: See history of present illness  GI: Negative; No nausea, vomiting, diarrhea, or abdominal pain GU: Negative; No dysuria, hematuria, or difficulty voiding Musculoskeletal: Negative; no myalgias, joint pain, or weakness Hematologic/Oncology: Negative; no easy bruising, bleeding Endocrine: Negative; no heat/cold intolerance; no diabetes Neuro: Negative; no changes in balance, headaches Skin: Negative; No rashes or skin lesions Psychiatric: Negative; No behavioral problems, depression Sleep: Negative; No snoring, daytime sleepiness, hypersomnolence, bruxism, restless legs, hypnogognic hallucinations, no cataplexy Other comprehensive 14 point system review is negative.   PE BP 116/74   Pulse 65   Ht _0  (1.727 m)   Wt 209 lb (94.8 kg)   BMI 31.78 kg/m    Repeat blood pressure by me was 122/70  Wt Readings from Last 3 Encounters:  11/27/16 209 lb (94.8 kg)  10/04/15 200 lb (90.7 kg)  09/12/15 195 lb (88.5 kg)   General: Alert, oriented, no distress.  Skin: normal turgor, no rashes HEENT: Normocephalic, atraumatic. Pupils round and reactive; sclera anicteric; Fundi no hemorrhages or exudates Nose without nasal septal hypertrophy Mouth/Parynx benign; Mallinpatti scale 3 Neck: No JVD, no carotid bruits; normal carotid upstroke Lungs: clear to ausculatation and percussion; no wheezing or rales No chest wall tenderness to palpation. Heart: RRR, s1 s2 normal faint 1/6 systolic murmur, no diastolic murmur.  No rubs, thrills or heaves.. Abdomen: Mild central adiposity, soft, nontender; no hepatosplenomehaly, BS+; abdominal aorta nontender and not dilated by palpation. Pulses 2+; Extremities: no clubbinbg cyanosis or edema, Homan's sign negative    Neurologic: grossly nonfocalftl Psychologic: Normal affect and mood  ECG (independently read by me): Normal sinus rhythm at 65 bpm.  No ectopy.  Normal intervals.  February 2016 ECG (independently read by me): Normal sinus rhythm at 62 bpm.  Normal ECG.  Normal intervals.  June 2015 ECG (independently read by me): Sinus bradycardia 49 beats per minute.  No ectopy.  Normal intervals.  Mild T-wave change in lead 1 and aVL.  ECG: Normal sinus rhythm at 62 beats per minute. QTc interval 403 ms. PR interval 164 ms.  LABS: BMP Latest Ref Rng &  Units 09/12/2015 12/31/2014 08/25/2013  Glucose 65 - 99 mg/dL 121(H) 87 92  BUN 6 - 20 mg/dL _0 Creatinine 0.61 - 1.24 mg/dL 1.52(H) 0.98 0.88  Sodium 135 - 145 mmol/L 137 142 139  Potassium 3.5 - 5.1 mmol/L 3.9 4.3 3.7  Chloride 101 - 111 mmol/L 101 105 106  CO2 22 - 32 mmol/L _1 Calcium 8.9 - 10.3 mg/dL 9.8 9.8 8.5   Hepatic Function Latest Ref Rng & Units 09/12/2015 12/31/2014 08/18/2013  Total Protein 6.5 - 8.1 g/dL 7.7 7.1 7.0  Albumin 3.5 - 5.0 g/dL 4.8 4.7 4.8  AST 15 - 41 U/L _2 ALT 17 - 63 U/L 31 34 27  Alk Phosphatase 38 - 126 U/L 48 56 41  Total Bilirubin 0.3 - 1.2 mg/dL 1.2 0.5 0.5   CBC Latest Ref Rng & Units 09/12/2015 12/31/2014 08/25/2013  WBC 4.0 - 10.5 K/uL 14.7(H) 5.7 5.9  Hemoglobin 13.0 - 17.0 g/dL 15.1 14.6 13.1  Hematocrit 39.0 - 52.0 % 41.5 42.4 36.2(L)  Platelets 150 - 400 K/uL 225 229 204   Lab Results  Component Value Date   MCV 84.9 09/12/2015   MCV 86.5 12/31/2014   MCV 84.8 08/25/2013   Lab Results  Component Value Date   TSH 9.850 (H) 12/31/2014   Lab Results  Component Value Date   HGBA1C 4.9 07/28/2012   Lipid Panel     Component Value Date/Time   CHOL 184 12/31/2014 1615   TRIG 192 (H) 12/31/2014 1615   HDL 27 (L) 12/31/2014 1615   CHOLHDL 6.8 12/31/2014 1615   VLDL 38 12/31/2014 1615   LDLCALC 119 (H) 12/31/2014 1615    RADIOLOGY: No results found.  IMPRESSION:  1.  CAD in native artery   2. Hyperlipidemia LDL goal <70   3. Essential hypertension   4. Drug therapy   5. Mild obesity   6. History of kidney stones     ASSESSMENT AND PLAN: Mr. Joss Mcdill is a 51 year old gentleman with cardiac risk factors notable for hypertension, hyperlipidemia, and strong family history for premature coronary artery disease in both parents as well as in his sister. He developed fairly classic exertionally precipitated substernal chest pain associated with exertional dyspnea.  He was found to have severe three-vessel disease with total occlusion of his mid RCA with both antegrade collaterals to the PDA system but also retrograde collaterals supplying the distal RCA beyond the PDA takeoff. He underwent two-vessel coronary intervention to a 99% mid LAD stenosis and a 80% proximal circumflex stenosis with drug-eluting stents.  His blood pressure today is controlled on his current dose of metoprolol, losartan, and isosorbide.  His last nuclear perfusion study in August 2016 shows entirely normal perfusion without evidence for scar or ischemia.  He continues to do well on dual antiplatelets therapy long-term with Brilinta 60 mg twice a day and aspirin 81 mg per Pegasys trial data.  His blood pressure today is stable on a medical regimen consisting of metoprolol 25 g twice a day, losartan 50 Milligan grams daily in addition to his isosorbide mononitrate 30 mg daily.  He's not having any recurrent anginal symptomatology.  He is on triple drug therapy for hyperlipidemia with atorvastatin 80 mg, Zetia 10 mg, and fenofibrate 160 mg daily.  I discussed with him new data showing significant improved outcome with LDL cholesterols as low as 30.  He will undergo fasting lipid panel and medications will be adjusted if  necessary.  I discussed weight loss.  He is obese and does not routinely exercise.  We discussed exercising at least 5 days per week for a minimum of 30 minutes.  He will see his  primary physician in 6 months.  I will see him in one year, such that we can alternate seeing him at six-month intervals.  Time spent: 25 minutes Troy Sine, MD, [facility] 11/27/2016 12:06 PM

## 2016-11-27 NOTE — Patient Instructions (Signed)
Your physician recommends that you return for lab work fasting.   Your physician wants you to follow-up in: 1 year or sooner if needed. You will receive a reminder letter in the mail two months in advance. If you don't receive a letter, please call our office to schedule the follow-up appointment.  If you need a refill on your cardiac medications before your next appointment, please call your pharmacy.    

## 2016-12-25 ENCOUNTER — Other Ambulatory Visit: Payer: Self-pay | Admitting: Cardiovascular Disease

## 2017-01-03 ENCOUNTER — Other Ambulatory Visit: Payer: Self-pay | Admitting: Cardiovascular Disease

## 2017-01-03 ENCOUNTER — Telehealth: Payer: Self-pay | Admitting: Cardiovascular Disease

## 2017-01-03 ENCOUNTER — Telehealth: Payer: Self-pay | Admitting: Physician Assistant

## 2017-01-03 MED ORDER — NITROGLYCERIN 0.4 MG SL SUBL: 0.4000 mg | SUBLINGUAL_TABLET | SUBLINGUAL | 3 refills | Status: DC | PRN

## 2017-01-03 NOTE — Telephone Encounter (Signed)
Patient call for refill of SL nitro.

## 2017-01-03 NOTE — Telephone Encounter (Signed)
Rx(s) sent to pharmacy electronically.  

## 2017-01-03 NOTE — Telephone Encounter (Signed)
New Message  Pt voiced wanting to speak with nurse pt would not go into detail.

## 2017-01-03 NOTE — Telephone Encounter (Signed)
No answer when dialed. 

## 2017-01-04 NOTE — Telephone Encounter (Signed)
Lm2cb 

## 2017-01-07 NOTE — Telephone Encounter (Signed)
Lm2cb 

## 2017-01-08 ENCOUNTER — Telehealth: Payer: Self-pay | Admitting: *Deleted

## 2017-01-08 NOTE — Telephone Encounter (Signed)
PT NEEDS YOU TO CALL HIM REGARDING BRILINTA 60 MG, ITS TO EXPENSIVE, HE WANTS SOMETHING DIFFERENT OR HELP, NEEDS SAMPLES AS WELL.

## 2017-01-08 NOTE — Telephone Encounter (Signed)
Brilinta 60 mg # 3 lot #[id number] exp 04-2017 with savings card left at front desk for pick Patient will call back if savings card does not work

## 2017-01-09 NOTE — Telephone Encounter (Signed)
Attempted to call. There was no answer.

## 2017-01-14 ENCOUNTER — Telehealth: Payer: Self-pay | Admitting: Cardiovascular Disease

## 2017-01-28 ENCOUNTER — Other Ambulatory Visit: Payer: Self-pay

## 2017-01-28 MED ORDER — ISOSORBIDE MONONITRATE ER 30 MG PO TB24: 30.0000 mg | ORAL_TABLET | Freq: Every day | ORAL | 7 refills | Status: DC

## 2017-04-10 DIAGNOSIS — I119 Hypertensive heart disease without heart failure: Secondary | ICD-10-CM | POA: Diagnosis not present

## 2017-04-10 DIAGNOSIS — I2510 Atherosclerotic heart disease of native coronary artery without angina pectoris: Secondary | ICD-10-CM | POA: Diagnosis not present

## 2017-04-10 DIAGNOSIS — Z125 Encounter for screening for malignant neoplasm of prostate: Secondary | ICD-10-CM | POA: Diagnosis not present

## 2017-04-10 DIAGNOSIS — E7800 Pure hypercholesterolemia, unspecified: Secondary | ICD-10-CM | POA: Diagnosis not present

## 2017-08-17 IMAGING — CT CT ABD-PELV W/ CM
2 of 5 series · 16 of 46 positions shown, 18 images · IV contrast (100 ML OMNI 300)
Comparison: None.

CLINICAL DATA: Right lower quadrant pain.  Nausea.

EXAM:
CT ABDOMEN AND PELVIS WITH CONTRAST
TECHNIQUE: Multidetector CT imaging of the abdomen and pelvis was performed
using the standard protocol following bolus administration of
intravenous contrast.
CONTRAST:  100mL OMNIPAQUE IOHEXOL 300 MG/ML  SOLN

[Series 2: abd/pel with · axial · 0.79mm/px · z∈[+1079,+1529]mm · 13 of 100 slices shown, 15 images]
[im 5/100  soft-tissue]
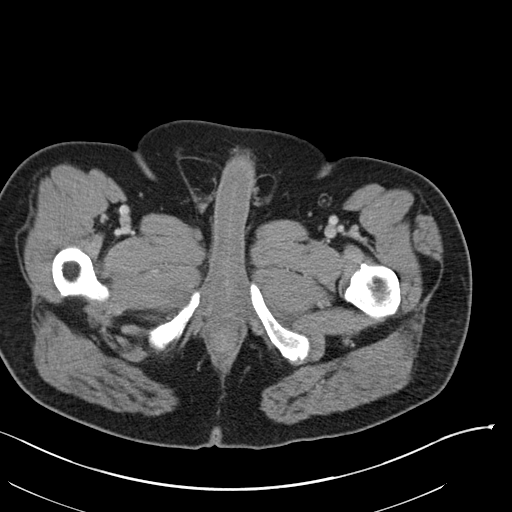
[im 5/100  bone]
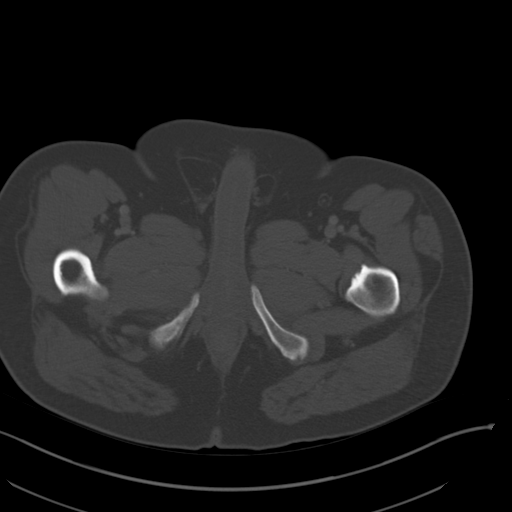
[im 15/100  soft-tissue]
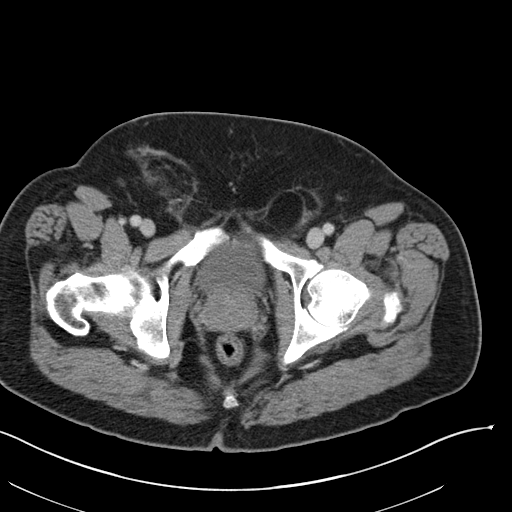
[im 19/100  soft-tissue]
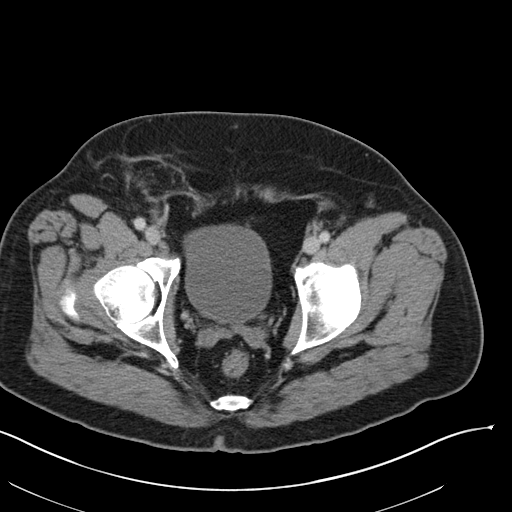
[im 29/100  soft-tissue]
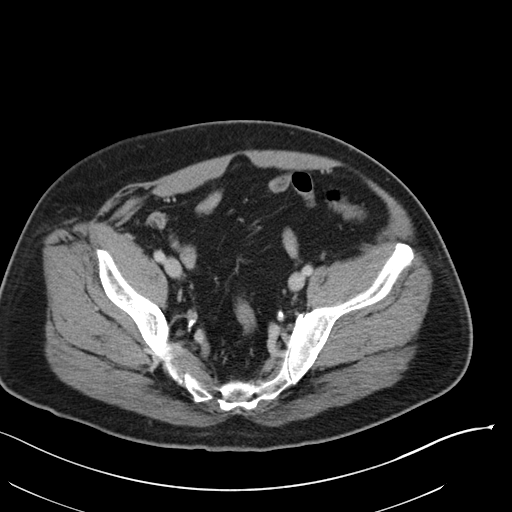
[im 34/100  soft-tissue]
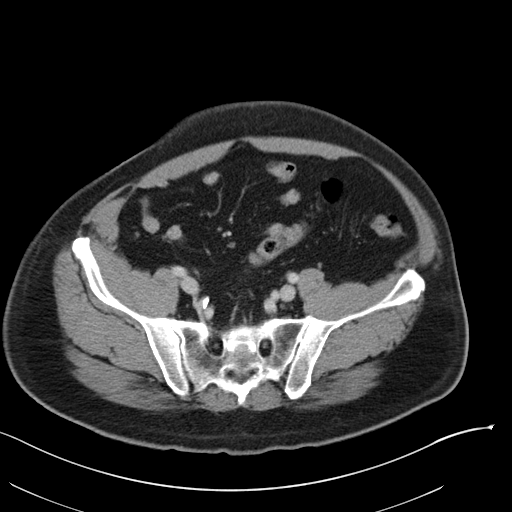
[im 43/100  soft-tissue]
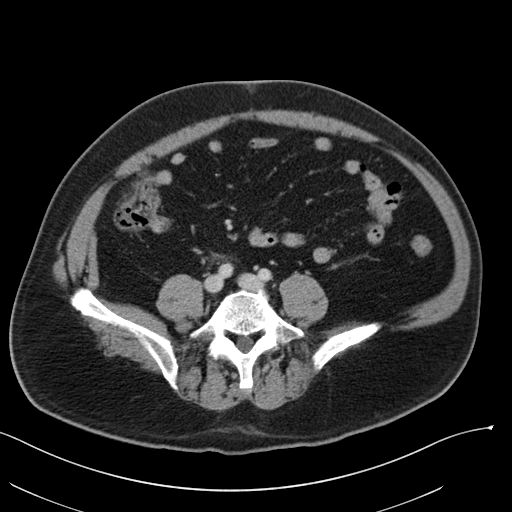
[im 52/100  soft-tissue]
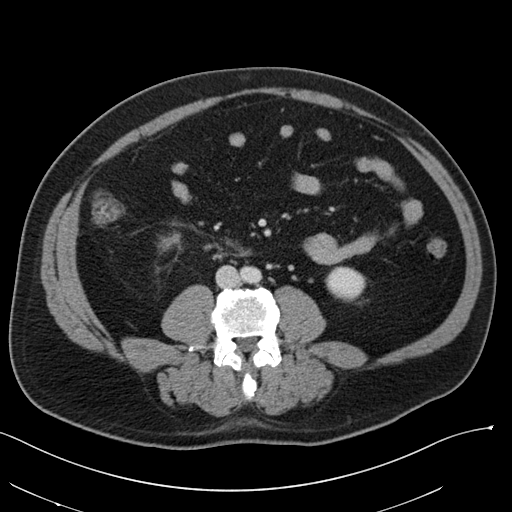
[im 57/100  soft-tissue]
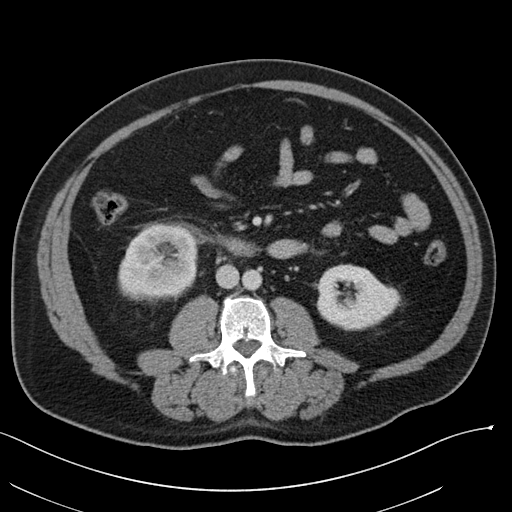
[im 67/100  soft-tissue]
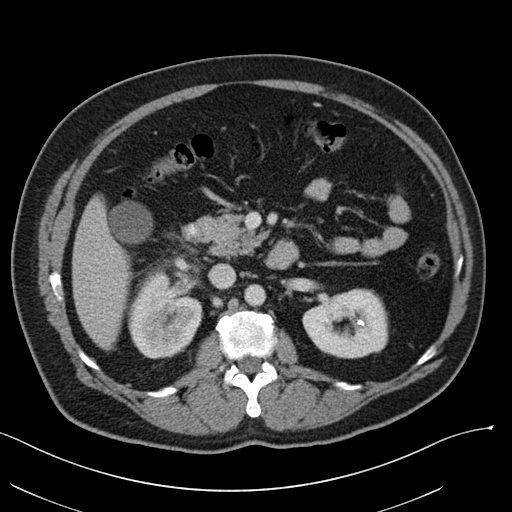
[im 67/100  bone]
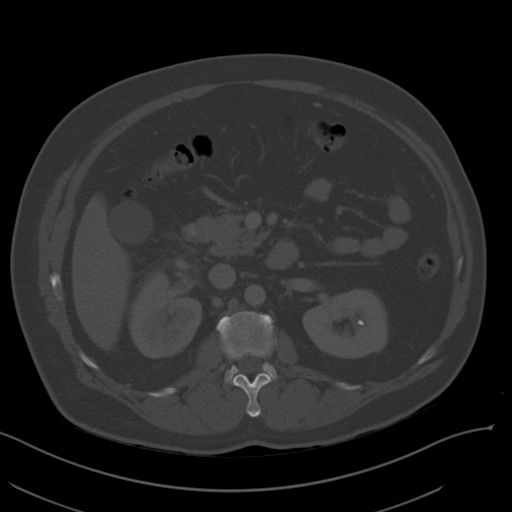
[im 71/100  soft-tissue]
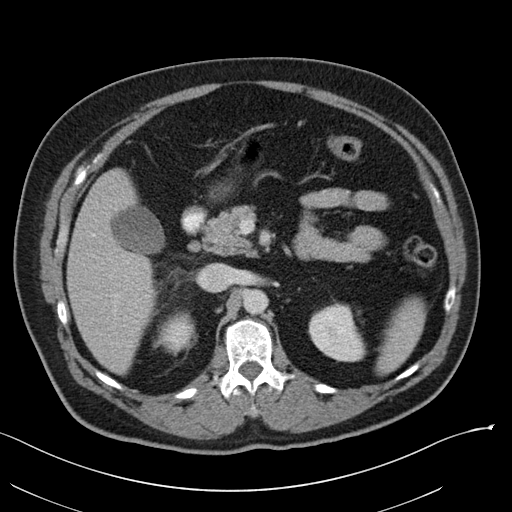
[im 81/100  soft-tissue]
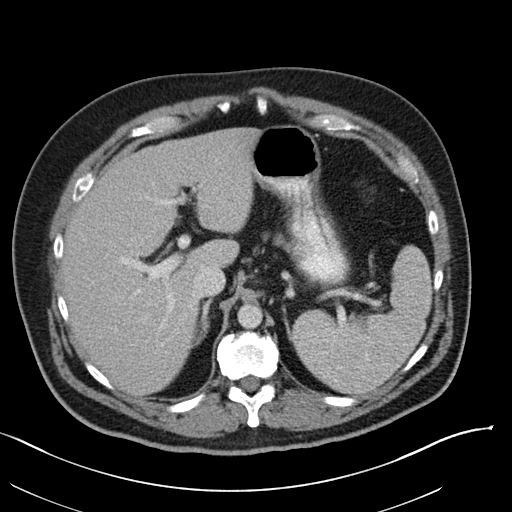
[im 85/100  soft-tissue]
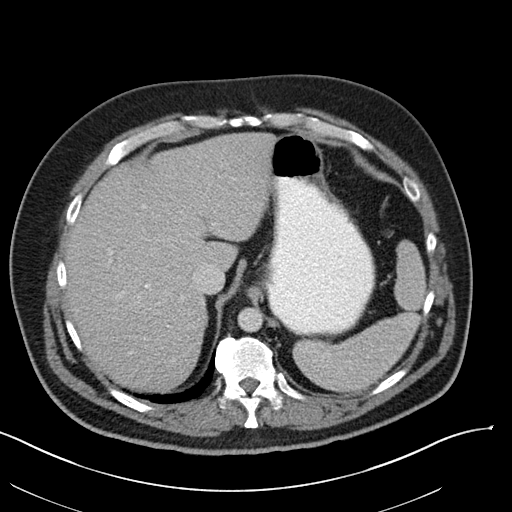
[im 95/100  soft-tissue]
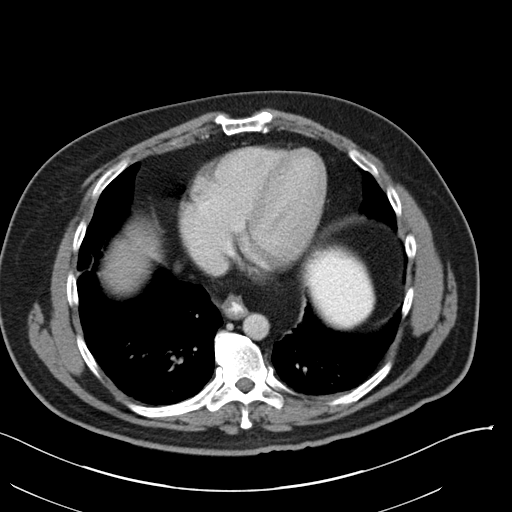

[Series 4: coronal a/|p · coronal · 0.75mm/px · 3 of 100 slices shown]
[im 34/100  soft-tissue]
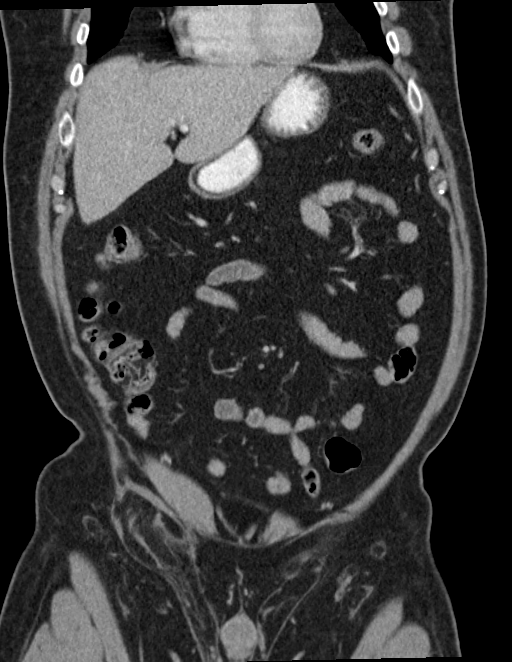
[im 45/100  soft-tissue]
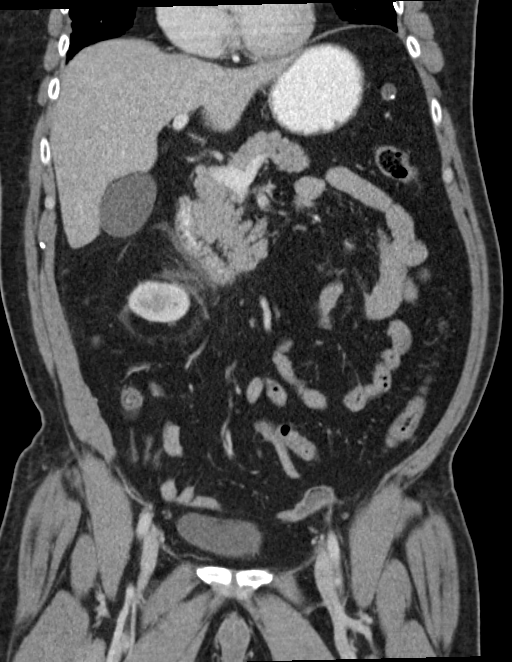
[im 56/100  soft-tissue]
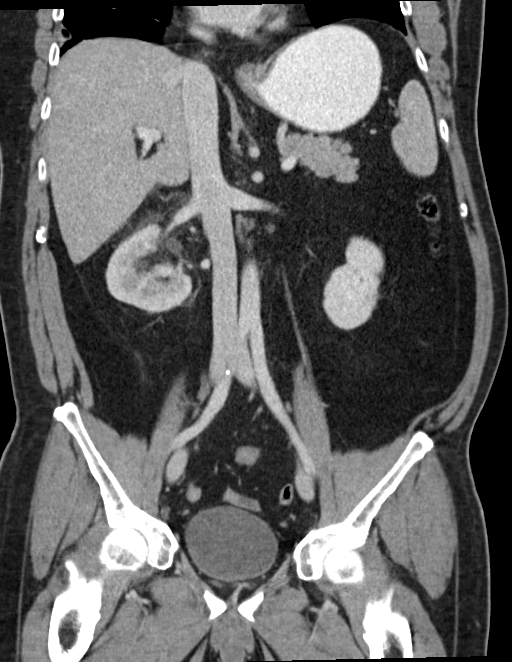

[16 of 46 positions shown; findings below may reference images not displayed]

FINDINGS: Lower chest:  Normal.

Hepatobiliary: Normal.

Pancreas: Normal.

Spleen: Normal.

Adrenals/Urinary Tract: Normal adrenal glands. 7 x 6 mm stone
obstructing the proximal right ureter just below the ureteropelvic
junction. There is also an 8 mm lobulated stone which is either
within or immediately adjacent to the distal right ureter but the
ureter above this level is not dilated 2 mm stone in the mid right
kidney. 8 mm stone in the upper pole of the left kidney. The bladder
is normal.

Stomach/Bowel: Normal.

Vascular/Lymphatic: Slight calcification in the iliac arteries. No
adenopathy.

Reproductive: Normal.

Other: No free air. Soft tissue stranding around the right kidney
adjacent to the second and third portions of the duodenum due to
urine extravasation.

Musculoskeletal: Negative.
IMPRESSION: 7 x 6 mm stone obstructing the proximal right ureter. Moderate right
hydronephrosis with extravasated urine.

8 mm stone in or adjacent to the distal right ureter. The ureter is
not dilated above the stone therefore I suspect this calcification
is extrinsic to the ureter.

## 2018-01-21 ENCOUNTER — Other Ambulatory Visit: Payer: Self-pay | Admitting: Cardiovascular Disease

## 2018-01-21 MED ORDER — NITROGLYCERIN 0.4 MG SL SUBL: 0.4000 mg | SUBLINGUAL_TABLET | SUBLINGUAL | 3 refills | Status: DC | PRN

## 2018-01-21 NOTE — Telephone Encounter (Signed)
°*  STAT* If patient is at the pharmacy, call can be transferred to refill team.  1. Which medications need to be refilled? (please list name of each medication and dose if known) NitrognitroGLYCERIN (NITROSTAT) 0.4 MG SL tablet  2. Which pharmacy/location (including street and city if local pharmacy) is medication to be sent to? [id number] [address] , [place] [phone]  3. Do they need a 30 day or 90 day supply? 30

## 2018-04-22 ENCOUNTER — Ambulatory Visit: Payer: BLUE CROSS/BLUE SHIELD | Admitting: Cardiovascular Disease

## 2018-04-22 ENCOUNTER — Encounter: Payer: Self-pay | Admitting: Cardiovascular Disease

## 2018-04-22 VITALS — BP 132/84 | HR 76 | Ht 66.5 in | Wt 221.2 lb

## 2018-04-22 DIAGNOSIS — E668 Other obesity: Secondary | ICD-10-CM

## 2018-04-22 DIAGNOSIS — I10 Essential (primary) hypertension: Secondary | ICD-10-CM

## 2018-04-22 DIAGNOSIS — E785 Hyperlipidemia, unspecified: Secondary | ICD-10-CM | POA: Diagnosis not present

## 2018-04-22 DIAGNOSIS — Z79899 Other long term (current) drug therapy: Secondary | ICD-10-CM | POA: Diagnosis not present

## 2018-04-22 DIAGNOSIS — I2510 Atherosclerotic heart disease of native coronary artery without angina pectoris: Secondary | ICD-10-CM | POA: Diagnosis not present

## 2018-04-22 NOTE — Patient Instructions (Signed)
Medication Instructions:  Your physician recommends that you continue on your current medications as directed. Please refer to the Current Medication list given to you today.  Labwork: Please return for FASTING labs (CMET, CBC, Lipid, TSH)  Our in office lab hours are Monday-Friday 8:00-4:00, closed for lunch 12:45-1:45 pm.  No appointment needed.  Follow-Up: 6 months with Dr. Tresa EndoKelly.    Any Other Special Instructions Will Be Listed Below (If Applicable).     If you need a refill on your cardiac medications before your next appointment, please call your pharmacy.

## 2018-04-22 NOTE — Progress Notes (Signed)
Patient ID: Gregory Nixon, male   DOB: [birth year], 52 y.o.   MRN: [id number]      HPI: [name] is a 52 year old male  presents to the office for a 9-monthfollow-up cardiology evaluation.   Mr. FKlemannhas a strong family history for CAD in both his mother who died at age 289with a massive heart attack and his father who is status post CABG revascularization surgery. His sister  has had multiple stents placed in her coronary arteries. The patient has a several year history of hypertension as well as hyperlipidemia.  In October 2014 he developed progressive exertional substernal chest tightness associated with left arm radiation and exertionally precipitated shortness of breath.  He saw Dr. EMarisue Humbleand was prescribed isosorbide 30 mg. I saw him for initial evaluation on 08/18/2013 and was concerned that his symptoms represented progressive accelerated angina pectoris. I added beta blocker and further titrated his isosorbide therapy. Cardiac catheterization on 08/24/2013 via the right radial artery approach revealed severe three-vessel coronary artery disease. His LAD had mild 30% narrowing before the diagonal vessel but after the diagonal vessel the LAD had a 99% eccentric focal stenosis. The proximal circumflex artery had an 80% smooth focal eccentric stenosis before the takeoff of the first marginal branch and there was a 50% stenosis in the midportion of the marginal vessel. The RCA was diffusely diseased and the mid RCA was completely occluded. There were bridging collaterals extending to the PDA. The distal RCA was occluded after the PDA takeoff and he did have some distal collateralization via left coronary circulation. Ejection fraction on ventriculography was 55%. I performed successful two-vessel coronary intervention and stent to the 99% mid LAD stenosis with a 2.75x12 mm Promus premier DES stent and the left circumflex coronary artery stenosis with a 3.5x15 mm sites expedition DES stent.  He was discharged the following day on increased medical regimen.  He underwent a nuclear perfusion study on 06/22/2015.  This was normal and revealed normal perfusion without evidence for prior infarction or evidence for ischemia.  Ejection fraction was 57% and he had normal wall motion.  He had issues with kidney stones and underwent cystoscopy with retrograde pyelogram with right ureteroscopic, basket extraction and double-J stent in December 2016.  I last saw him in January 2018, he has been relatively stable with reference to chest pain or shortness of breath.  He does not routinely exercise and typically may walk 2 days/week.  He works at [employer]  He denies any palpitations PND orthopnea.  He has not been successful with weight loss.  Since his coronary intervention he has remained stable.  He has been maintained on atorvastatin 80 mg, Zetia 10 mg, and fenofibrate 160 mg for hyperlipidemia.  He has not had lipid studies done since his primary physician check this in June 2018 and LDL at that time was elevated at 122.  He continues to be on isosorbide 30 mg daily, metoprolol 25 twice a day, losartan 50 mg for hypertension and his CAD.  He is on aspirin 81 mg and is no longer on Brilinta.  He presents for follow-up evaluation.  Past Medical History:  Diagnosis Date  . Arthritis    hands  . At risk for sleep apnea    STOP-BANG= 5     SENT TO PCP 09-29-2015  . Coronary artery disease cardiologist-  dr tShelva Majestic  08-24-2013 s/p  PCI and DES x2  to LAD and pCFX    .  Family history of premature CAD   . History of gout    lower extremities-- per pt no issues since 2014  . Hypertension   . Right ureteral stone   . S/P drug eluting coronary stent placement    08-24-2013  to LAD and pCFX    Past Surgical History:  Procedure Laterality Date  . CARDIOVASCULAR STRESS TEST  06-21-2015   dr  Shelva Majestic   normal nuclear study/  no ischemia or scar/ normal LV function and wall motion  , ef 57%  . CYSTECTOMY  age 53   left leg  . CYSTOSCOPY WITH RETROGRADE PYELOGRAM, URETEROSCOPY AND STENT PLACEMENT Right 10/04/2015   Procedure: CYSTOSCOPY WITH RETROGRADE PYELOGRAM WITH INTERPERTATION RIGHT URETEROSCOPY  BASKET EXTRACTION DOUBLE J STENT;  Surgeon: Festus Aloe, MD;  Location: [facility];  Service: Urology;  Laterality: Right;  . HOLMIUM LASER APPLICATION Right 86/04/6194   Procedure: HOLMIUM LASER APPLICATION;  Surgeon: Festus Aloe, MD;  Location: [facility];  Service: Urology;  Laterality: Right;  . INGUINAL HERNIA REPAIR  09/04/2011   Procedure: LAPAROSCOPIC INGUINAL HERNIA;  Surgeon: Pedro Earls, MD;  Location: [facility];  Service: General;  Laterality: Right;  . INGUINAL HERNIA REPAIR  09/04/2011   Procedure: HERNIA REPAIR INGUINAL ADULT;  Surgeon: Pedro Earls, MD;  Location: [facility];  Service: General;  Laterality: N/A;  . LEFT AND RIGHT HEART CATHETERIZATION WITH CORONARY ANGIOGRAM N/A 08/24/2013   Procedure: LEFT AND RIGHT HEART CATHETERIZATION WITH CORONARY ANGIOGRAM;  Surgeon: Troy Sine, MD;  Location: [facility] C[facility];  Service: Cardiovascular;  Laterality: N/A;  . PERCUTANEOUS CORONARY STENT INTERVENTION (PCI-S) N/A 08/24/2013   Procedure: PERCUTANEOUS CORONARY STENT INTERVENTION (PCI-S);  Surgeon: Troy Sine, MD;  Location: [facility] C[facility];  Service: Cardiovascular;  Laterality: N/A;   Severe 3 vessel CAD/  Total occlusion mRCA and dRCA with collaterals ,  DEStenting to LAD and pCFX,  ef 55%  . REPAIR RIGHT SCROTAL HERNIA  05-30-2011    No Known Allergies  Current Outpatient Medications  Medication Sig Dispense Refill  . aspirin EC 81 MG tablet Take 81 mg by mouth daily.    Marland Kitchen atorvastatin (LIPITOR) 80 MG tablet Take 1 tablet (80 mg total) by mouth daily at 6 PM. (Patient taking differently: Take 80 mg by mouth every morning. ) 30 tablet 5  . ezetimibe (ZETIA) 10 MG tablet Take 1 tablet (10 mg total) by mouth daily. 30  tablet 6  . fenofibrate 160 MG tablet Take 160 mg by mouth every morning.     Marland Kitchen ibuprofen (ADVIL,MOTRIN) 200 MG tablet Take 400 mg by mouth every 6 (six) hours as needed (for pain.).    Marland Kitchen isosorbide mononitrate (IMDUR) 30 MG 24 hr tablet Take 1 tablet (30 mg total) by mouth daily. 30 tablet 7  . losartan (COZAAR) 50 MG tablet Take 50 mg by mouth every morning.     . metoprolol tartrate (LOPRESSOR) 25 MG tablet Take 1 tablet (25 mg total) by mouth 2 (two) times daily. 60 tablet 3  . nitroGLYCERIN (NITROSTAT) 0.4 MG SL tablet Place 1 tablet (0.4 mg total) under the tongue every 5 (five) minutes as needed for chest pain. 25 tablet 3  . ondansetron (ZOFRAN) 4 MG tablet Take 1 tablet (4 mg total) by mouth every 6 (six) hours as needed for nausea or vomiting. 12 tablet 0  . oxyCODONE-acetaminophen (ROXICET) 5-325 MG tablet Take 1-2 tablets by mouth every 6 (six) hours as  needed for severe pain. 20 tablet 0  . tamsulosin (FLOMAX) 0.4 MG CAPS capsule Take 1 capsule (0.4 mg total) by mouth daily. (Patient taking differently: Take 0.4 mg by mouth daily after breakfast. ) 10 capsule 0  . traMADol (ULTRAM) 50 MG tablet Take 50 mg by mouth every 6 (six) hours as needed.     No current facility-administered medications for this visit.     Social history is notable in that he is married for 9 years. He works as a Presenter, broadcasting. He completed 12th grade of education. He has one child age 90-2 does not routinely exercise but previously had been trying to walk intermittently. There is no tobacco history. He seldom drinks beer or wine.  Family History  Problem Relation Age of Onset  . Heart disease Mother   . Heart disease Father   . Thyroid disease Father   . Heart disease Sister   . Thyroid disease Sister   . Diabetes Sister     Since I last saw him, his father passed away 1 year ago .  One week after PVD intervention.  He continues to smoke cigarettes.  ROS General: Negative; No fevers, chills,  or night sweats; positive for mild obesity HEENT: Negative; No changes in vision or hearing, sinus congestion, difficulty swallowing Pulmonary: Negative; No cough, wheezing, shortness of breath, hemoptysis Cardiovascular: See history of present illness  GI: Negative; No nausea, vomiting, diarrhea, or abdominal pain GU: Negative; No dysuria, hematuria, or difficulty voiding Musculoskeletal: Negative; no myalgias, joint pain, or weakness Hematologic/Oncology: Negative; no easy bruising, bleeding Endocrine: Negative; no heat/cold intolerance; no diabetes Neuro: Negative; no changes in balance, headaches Skin: Negative; No rashes or skin lesions Psychiatric: Negative; No behavioral problems, depression Sleep: Negative; No snoring, daytime sleepiness, hypersomnolence, bruxism, restless legs, hypnogognic hallucinations, no cataplexy Other comprehensive 14 point system review is negative.   PE BP 132/84   Pulse 76   Ht 5' 6.5" (1.689 m)   Wt 221 lb 3.2 oz (100.3 kg)   BMI 35.17 kg/m    Repeat blood pressure was 112/70  Wt Readings from Last 3 Encounters:  04/22/18 221 lb 3.2 oz (100.3 kg)  11/27/16 209 lb (94.8 kg)  10/04/15 200 lb (90.7 kg)   General: Alert, oriented, no distress.  Moderate obesity Skin: normal turgor, no rashes, warm and dry HEENT: Normocephalic, atraumatic. Pupils equal round and reactive to light; sclera anicteric; extraocular muscles intact; Nose without nasal septal hypertrophy Mouth/Parynx benign; Mallinpatti scale 3 Neck: No JVD, no carotid bruits; normal carotid upstroke Lungs: clear to ausculatation and percussion; no wheezing or rales Chest wall: without tenderness to palpitation Heart: PMI not displaced, RRR, s1 s2 normal, 1/6 systolic murmur, no diastolic murmur, no rubs, gallops, thrills, or heaves Abdomen: Moderate central adiposity;soft, nontender; no hepatosplenomehaly, BS+; abdominal aorta nontender and not dilated by palpation. Back: no CVA  tenderness Pulses 2+ Musculoskeletal: full range of motion, normal strength, no joint deformities Extremities: no clubbing cyanosis or edema, Homan's sign negative  Neurologic: grossly nonfocal; Cranial nerves grossly wnl Psychologic: Normal mood and affect   ECG (independently read by me): Normal sinus rhythm at 76 bpm.  No ectopy.  Normal intervals.  No ST segment changes.  January 2018 ECG (independently read by me): Normal sinus rhythm at 65 bpm.  No ectopy.  Normal intervals.  February 2016 ECG (independently read by me): Normal sinus rhythm at 62 bpm.  Normal ECG.  Normal intervals.  June 2015 ECG (independently read by  me): Sinus bradycardia 49 beats per minute.  No ectopy.  Normal intervals.  Mild T-wave change in lead 1 and aVL.  ECG: Normal sinus rhythm at 62 beats per minute. QTc interval 403 ms. PR interval 164 ms.  LABS: BMP Latest Ref Rng & Units 09/12/2015 12/31/2014 08/25/2013  Glucose 65 - 99 mg/dL 121(H) 87 92  BUN 6 - 20 mg/dL _0 Creatinine 0.61 - 1.24 mg/dL 1.52(H) 0.98 0.88  Sodium 135 - 145 mmol/L 137 142 139  Potassium 3.5 - 5.1 mmol/L 3.9 4.3 3.7  Chloride 101 - 111 mmol/L 101 105 106  CO2 22 - 32 mmol/L _1 Calcium 8.9 - 10.3 mg/dL 9.8 9.8 8.5   Hepatic Function Latest Ref Rng & Units 09/12/2015 12/31/2014 08/18/2013  Total Protein 6.5 - 8.1 g/dL 7.7 7.1 7.0  Albumin 3.5 - 5.0 g/dL 4.8 4.7 4.8  AST 15 - 41 U/L _2 ALT 17 - 63 U/L 31 34 27  Alk Phosphatase 38 - 126 U/L 48 56 41  Total Bilirubin 0.3 - 1.2 mg/dL 1.2 0.5 0.5   CBC Latest Ref Rng & Units 09/12/2015 12/31/2014 08/25/2013  WBC 4.0 - 10.5 K/uL 14.7(H) 5.7 5.9  Hemoglobin 13.0 - 17.0 g/dL 15.1 14.6 13.1  Hematocrit 39.0 - 52.0 % 41.5 42.4 36.2(L)  Platelets 150 - 400 K/uL 225 229 204   Lab Results  Component Value Date   MCV 84.9 09/12/2015   MCV 86.5 12/31/2014   MCV 84.8 08/25/2013   Lab Results  Component Value Date   TSH 9.850 (H) 12/31/2014   Lab Results    Component Value Date   HGBA1C 4.9 07/28/2012   Lipid Panel     Component Value Date/Time   CHOL 184 12/31/2014 1615   TRIG 192 (H) 12/31/2014 1615   HDL 27 (L) 12/31/2014 1615   CHOLHDL 6.8 12/31/2014 1615   VLDL 38 12/31/2014 1615   LDLCALC 119 (H) 12/31/2014 1615    RADIOLOGY: No results found.  IMPRESSION:  1. CAD in native artery   2. Essential hypertension   3. Hyperlipidemia LDL goal <70   4. Medication management   5. Moderate obesity     ASSESSMENT AND PLAN: Gregory Nixon is a 52 year old gentleman with cardiac risk factors notable for hypertension, hyperlipidemia, and strong family history for premature coronary artery disease in both parents as well as in his sister. He developed fairly classic exertionally precipitated substernal chest pain associated with exertional dyspnea.  In October 2014 was found to have severe three-vessel disease with total occlusion of his mid RCA with both antegrade collaterals to the PDA system but also retrograde collaterals supplying the distal RCA beyond the PDA takeoff. He underwent two-vessel coronary intervention to a 99% mid LAD stenosis and a 80% proximal circumflex stenosis with drug-eluting stents.  Had been maintained on dual antiplatelet therapy when I last saw him I started him on reduced dose of Brilinta.  Presently he is just on aspirin alone.  He has been without anginal symptomatology on his current medical regimen.  His blood pressure today is stable on isosorbide 30 mg, losartan 50 mg, metoprolol tartrate 25 mg twice a day.  He is on combination therapy for his mixed hyperlipidemia including atorvastatin 80 mg, Zetia 10 mg, and fenofibrate.  When laboratory was checked by his primary physician in June 2018 LDL was still significantly elevated.  I had a long discussion with him today regarding PCSK9 inhibition with  Repatha and discussed in detail the Fourier trial.we will repeat fasting laboratory.  If his LDL continues to be  above target despite his current therapy I will refer him to our pharmacist for initiation of Repatha.  He has Pharmacist, community and his co-pay should be as little as $5.  He does not routinely exercise.  I discussed current guidelines based by the American Heart Association including at least 5 days a week of 30 minutes of moderate intensity exercise.  He is moderately obese and we discussed the importance of weight loss and proper diet.  His lipid studies become elevated he will be seen for Repatha initiation we will plan to see him in 3 to 6 months for reevaluation.   Spent: 30 minutes Troy Sine, MD, [facility] 04/22/2018 6:04 PM

## 2018-05-05 DIAGNOSIS — Z79899 Other long term (current) drug therapy: Secondary | ICD-10-CM | POA: Diagnosis not present

## 2018-05-05 DIAGNOSIS — I10 Essential (primary) hypertension: Secondary | ICD-10-CM | POA: Diagnosis not present

## 2018-05-05 DIAGNOSIS — I2510 Atherosclerotic heart disease of native coronary artery without angina pectoris: Secondary | ICD-10-CM | POA: Diagnosis not present

## 2018-05-05 DIAGNOSIS — E785 Hyperlipidemia, unspecified: Secondary | ICD-10-CM | POA: Diagnosis not present

## 2018-05-07 LAB — COMPREHENSIVE METABOLIC PANEL
A/G RATIO: 2 (ref 1.2–2.2)
ALK PHOS: 57 IU/L (ref 39–117)
ALT: 39 IU/L (ref 0–44)
AST: 28 IU/L (ref 0–40)
Albumin: 4.5 g/dL (ref 3.5–5.5)
BILIRUBIN TOTAL: 0.4 mg/dL (ref 0.0–1.2)
BUN/Creatinine Ratio: 17 (ref 9–20)
BUN: 16 mg/dL (ref 6–24)
CO2: 22 mmol/L (ref 20–29)
Calcium: 9.9 mg/dL (ref 8.7–10.2)
Chloride: 102 mmol/L (ref 96–106)
Creatinine, Ser: 0.93 mg/dL (ref 0.76–1.27)
GFR calc Af Amer: 109 mL/min/{1.73_m2} (ref >59)
GFR calc non Af Amer: 94 mL/min/{1.73_m2} (ref >59)
GLOBULIN, TOTAL: 2.3 g/dL (ref 1.5–4.5)
Glucose: 84 mg/dL (ref 65–99)
Potassium: 4.9 mmol/L (ref 3.5–5.2)
SODIUM: 140 mmol/L (ref 134–144)
Total Protein: 6.8 g/dL (ref 6.0–8.5)

## 2018-05-07 LAB — CBC
HEMOGLOBIN: 15.5 g/dL (ref 13.0–17.7)
Hematocrit: 45.1 % (ref 37.5–51.0)
MCH: 30.3 pg (ref 26.6–33.0)
MCHC: 34.4 g/dL (ref 31.5–35.7)
MCV: 88 fL (ref 79–97)
PLATELETS: 210 10*3/uL (ref 150–450)
RBC: 5.12 x10E6/uL (ref 4.14–5.80)
RDW: 13.7 % (ref 12.3–15.4)
WBC: 5 10*3/uL (ref 3.4–10.8)

## 2018-05-07 LAB — LIPID PANEL
CHOLESTEROL TOTAL: 184 mg/dL (ref 100–199)
Chol/HDL Ratio: 6.6 ratio — ABNORMAL HIGH (ref 0.0–5.0)
HDL: 28 mg/dL — AB (ref >39)
LDL CALC: 104 mg/dL — AB (ref 0–99)
Triglycerides: 258 mg/dL — ABNORMAL HIGH (ref 0–149)
VLDL CHOLESTEROL CAL: 52 mg/dL — AB (ref 5–40)

## 2018-05-07 LAB — TSH: TSH: 6.46 u[IU]/mL — ABNORMAL HIGH (ref 0.450–4.500)

## 2018-05-29 ENCOUNTER — Ambulatory Visit: Payer: BLUE CROSS/BLUE SHIELD

## 2018-06-05 ENCOUNTER — Ambulatory Visit (INDEPENDENT_AMBULATORY_CARE_PROVIDER_SITE_OTHER): Payer: BLUE CROSS/BLUE SHIELD | Admitting: Pharmacist

## 2018-06-05 DIAGNOSIS — E785 Hyperlipidemia, unspecified: Secondary | ICD-10-CM | POA: Diagnosis not present

## 2018-06-05 NOTE — Patient Instructions (Addendum)
Lipid Clinic (pharmacist) Kojo Liby/Kristin [phone]  *START Repatha/Praluent paperwork* *Plan to repeat fasting blood work after 6 injections*   Cholesterol Cholesterol is a fat. Your body needs a small amount of cholesterol. Cholesterol (plaque) may build up in your blood vessels (arteries). That makes you more likely to have a heart attack or stroke. You cannot feel your cholesterol level. Having a blood test is the only way to find out if your level is high. Keep your test results. Work with your doctor to keep your cholesterol at a good level. What do the results mean?  Total cholesterol is how much cholesterol is in your blood.  LDL is bad cholesterol. This is the type that can build up. Try to have low LDL.  HDL is good cholesterol. It cleans your blood vessels and carries LDL away. Try to have high HDL.  Triglycerides are fat that the body can store or burn for energy. What are good levels of cholesterol?  Total cholesterol below 200.  LDL below 100 is good for people who have health risks. LDL below 70 is good for people who have very high risks.  HDL above 40 is good. It is best to have HDL of 60 or higher.  Triglycerides below 150. How can I lower my cholesterol? Diet Follow your diet program as told by your doctor.  Choose fish, white meat chicken, or [place] that is roasted or baked. Try not to eat red meat, fried foods, sausage, or lunch meats.  Eat lots of fresh fruits and vegetables.  Choose whole grains, beans, pasta, potatoes, and cereals.  Choose olive oil, corn oil, or canola oil. Only use small amounts.  Try not to eat butter, mayonnaise, shortening, or palm kernel oils.  Try not to eat foods with trans fats.  Choose low-fat or nonfat dairy foods. ? Drink skim or nonfat milk. ? Eat low-fat or nonfat yogurt and cheeses. ? Try not to drink whole milk or cream. ? Try not to eat ice cream, egg yolks, or full-fat cheeses.  Healthy desserts include  angel food cake, ginger snaps, animal crackers, hard candy, popsicles, and low-fat or nonfat frozen yogurt. Try not to eat pastries, cakes, pies, and cookies.  Exercise Follow your exercise program as told by your doctor.  Be more active. Try gardening, walking, and taking the stairs.  Ask your doctor about ways that you can be more active.  Medicine  Take over-the-counter and prescription medicines only as told by your doctor. This information is not intended to replace advice given to you by your health care provider. Make sure you discuss any questions you have with your health care provider. Document Released: 01/11/2009 Document Revised: 05/16/2016 Document Reviewed: 04/26/2016 Elsevier Interactive Patient Education  [employer]2018 Elsevier Inc.

## 2018-06-05 NOTE — Progress Notes (Signed)
Patient ID: Gregory Nixon                 DOB: 1966-02-27                    MRN: [id number]     HPI: Gregory Nixon is a 52 y.o. male patient referred to lipid clinic by Dr Tresa EndoKelly. PMH is significant for hypertension, unstable angina, CAD s/p stent placement, and hyperlipidemia. Patient is on high intensity stain therapy in combination with fibrate and ezetimibe. Despite combination therapy, his LDL remains above goal for secondary prevention. Patient reports compliance with all medication. He presents to lipid clinic today for potential PCSK9i initiation and counseling.  Current Medications:  Atorvastatin 80mg  daily Ezetimibe 10mg  daily Fenofibrate 160mg  daily  Intolerances: none  LDL goal: < 70mg /dL  Diet: salads , chicken, not much bread or starchy food.  Exercise: activities of daily living  Family History: heart disease in mother, father and sister; diabetes in sister  Social History: seldom drinks beer or wine, denies tobacco use  Labs: 05/07/18: CHO 184; TG 258; HDL 28; LDL-c 104 (atorvastatin 80mg  , ezetimibe and fenofibrate)  Past Medical History:  Diagnosis Date  . Arthritis    hands  . At risk for sleep apnea    STOP-BANG= 5     SENT TO PCP 09-29-2015  . Coronary artery disease cardiologist-  dr Gregory Nixon   08-24-2013 s/p  PCI and DES x2  to LAD and pCFX    . Family history of premature CAD   . History of gout    lower extremities-- per pt no issues since 2014  . Hypertension   . Right ureteral stone   . S/P drug eluting coronary stent placement    08-24-2013  to LAD and pCFX    Current Outpatient Medications on File Prior to Visit  Medication Sig Dispense Refill  . aspirin EC 81 MG tablet Take 81 mg by mouth daily.    Marland Kitchen. atorvastatin (LIPITOR) 80 MG tablet Take 1 tablet (80 mg total) by mouth daily at 6 PM. (Patient taking differently: Take 80 mg by mouth every morning. ) 30 tablet 5  . ezetimibe (ZETIA) 10 MG tablet Take 1 tablet (10 mg total) by mouth  daily. 30 tablet 6  . fenofibrate 160 MG tablet Take 160 mg by mouth every morning.     Marland Kitchen. ibuprofen (ADVIL,MOTRIN) 200 MG tablet Take 400 mg by mouth every 6 (six) hours as needed (for pain.).    Marland Kitchen. isosorbide mononitrate (IMDUR) 30 MG 24 hr tablet Take 1 tablet (30 mg total) by mouth daily. 30 tablet 7  . losartan (COZAAR) 50 MG tablet Take 50 mg by mouth every morning.     . metoprolol tartrate (LOPRESSOR) 25 MG tablet Take 1 tablet (25 mg total) by mouth 2 (two) times daily. 60 tablet 3  . nitroGLYCERIN (NITROSTAT) 0.4 MG SL tablet Place 1 tablet (0.4 mg total) under the tongue every 5 (five) minutes as needed for chest pain. 25 tablet 3  . ondansetron (ZOFRAN) 4 MG tablet Take 1 tablet (4 mg total) by mouth every 6 (six) hours as needed for nausea or vomiting. 12 tablet 0  . oxyCODONE-acetaminophen (ROXICET) 5-325 MG tablet Take 1-2 tablets by mouth every 6 (six) hours as needed for severe pain. 20 tablet 0  . tamsulosin (FLOMAX) 0.4 MG CAPS capsule Take 1 capsule (0.4 mg total) by mouth daily. (Patient taking differently: Take 0.4 mg by mouth  daily after breakfast. ) 10 capsule 0  . traMADol (ULTRAM) 50 MG tablet Take 50 mg by mouth every 6 (six) hours as needed.     No current facility-administered medications on file prior to visit.     No Known Allergies  Hyperlipidemia LDL goal <70 LDL remains above goal for secondary prevention. Patient current on high intensity statin therapy with atorvastatin 80mg  daily plus ezetimibe. He continues to work on positive lifestyle modifications and reports compliance with all this medication. PCSK9i therapy is an appropriate next step for this patient. Will initiate prior authorization for Repatha/Praluent. Plan to repeat fasting lipid panel after 5-6 doses of Repatha/Praluent.   Repatha/Praleunt indication, MOA, storage, administration, common side effects, prior authorization process, and financial assistance discussed during counseling.    Gregory Mosco  Nixon PharmD, BCPS, CPP [facility] [address] [place] [address] 06/06/2018 7:58 AM

## 2018-06-06 ENCOUNTER — Encounter: Payer: Self-pay | Admitting: Pharmacist

## 2018-06-06 ENCOUNTER — Other Ambulatory Visit: Payer: Self-pay | Admitting: Pharmacist

## 2018-06-06 MED ORDER — ALIROCUMAB 150 MG/ML ~~LOC~~ SOPN: 150.0000 mg | PEN_INJECTOR | SUBCUTANEOUS | 11 refills | Status: DC

## 2018-06-06 NOTE — Assessment & Plan Note (Signed)
LDL remains above goal for secondary prevention. Patient current on high intensity statin therapy with atorvastatin 80mg  daily plus ezetimibe. He continues to work on positive lifestyle modifications and reports compliance with all this medication. PCSK9i therapy is an appropriate next step for this patient. Will initiate prior authorization for Repatha/Praluent. Plan to repeat fasting lipid panel after 5-6 doses of Repatha/Praluent.   Repatha/Praleunt indication, MOA, storage, administration, common side effects, prior authorization process, and financial assistance discussed during counseling.

## 2018-07-01 DIAGNOSIS — R946 Abnormal results of thyroid function studies: Secondary | ICD-10-CM | POA: Diagnosis not present

## 2018-07-01 DIAGNOSIS — I2510 Atherosclerotic heart disease of native coronary artery without angina pectoris: Secondary | ICD-10-CM | POA: Diagnosis not present

## 2018-07-01 DIAGNOSIS — Z23 Encounter for immunization: Secondary | ICD-10-CM | POA: Diagnosis not present

## 2018-07-01 DIAGNOSIS — E7800 Pure hypercholesterolemia, unspecified: Secondary | ICD-10-CM | POA: Diagnosis not present

## 2018-07-01 DIAGNOSIS — I119 Hypertensive heart disease without heart failure: Secondary | ICD-10-CM | POA: Diagnosis not present

## 2018-07-22 DIAGNOSIS — R1084 Generalized abdominal pain: Secondary | ICD-10-CM | POA: Diagnosis not present

## 2018-07-22 DIAGNOSIS — M545 Low back pain: Secondary | ICD-10-CM | POA: Diagnosis not present

## 2018-09-01 DIAGNOSIS — E039 Hypothyroidism, unspecified: Secondary | ICD-10-CM | POA: Diagnosis not present

## 2018-10-17 ENCOUNTER — Ambulatory Visit: Payer: BLUE CROSS/BLUE SHIELD | Admitting: Cardiovascular Disease

## 2018-11-03 ENCOUNTER — Telehealth: Payer: Self-pay

## 2018-11-03 DIAGNOSIS — E785 Hyperlipidemia, unspecified: Secondary | ICD-10-CM

## 2018-11-03 NOTE — Telephone Encounter (Signed)
Called to see if pt still taking praluent and if so due for blood work fasting lipids and left msg

## 2018-11-19 NOTE — Telephone Encounter (Signed)
Called pt to let them know that labs are needed for praluent pa

## 2018-11-19 NOTE — Addendum Note (Signed)
Addended by: Eather Colas on: 11/19/2018 09:14 AM   Modules accepted: Orders

## 2018-12-02 DIAGNOSIS — E039 Hypothyroidism, unspecified: Secondary | ICD-10-CM | POA: Diagnosis not present

## 2018-12-15 ENCOUNTER — Other Ambulatory Visit: Payer: Self-pay | Admitting: Pharmacist

## 2018-12-15 ENCOUNTER — Telehealth: Payer: Self-pay

## 2018-12-15 MED ORDER — EVOLOCUMAB 140 MG/ML ~~LOC~~ SOAJ: 140.0000 mg | SUBCUTANEOUS | 11 refills | Status: DC

## 2018-12-15 NOTE — Telephone Encounter (Signed)
Praluent never stated. Patient never used co-pay card provided in August and cost too high to afford.  Patient also reports need to use [facility] as current pharmacy due to insurance requirements.    Will work on initiating Repatha 140mg  [place] every 14 days as soon as possible. Patient is to contact clinic is unable to afford medication.

## 2018-12-15 NOTE — Telephone Encounter (Signed)
Called and made a 2nd attempt to reach pt regarding overdue labs for praluent

## 2018-12-29 ENCOUNTER — Telehealth: Payer: Self-pay | Admitting: Pharmacist

## 2018-12-29 NOTE — Telephone Encounter (Signed)
Repatha covered by insurance with co-pay of $273 per month. Copay card given to patient to decrease cost to $5 per month but not presented to pharmacy yet.  LMOM; patient to go to pharmacy with copay card to pick up mediation for $5 per months or call back if card lost. I can provide another one ASAP.

## 2019-02-16 ENCOUNTER — Other Ambulatory Visit: Payer: Self-pay | Admitting: Cardiovascular Disease

## 2019-04-21 DIAGNOSIS — M792 Neuralgia and neuritis, unspecified: Secondary | ICD-10-CM | POA: Diagnosis not present

## 2019-04-21 DIAGNOSIS — M6248 Contracture of muscle, other site: Secondary | ICD-10-CM | POA: Diagnosis not present

## 2019-05-22 ENCOUNTER — Telehealth: Payer: Self-pay | Admitting: Cardiovascular Disease

## 2019-05-22 DIAGNOSIS — E785 Hyperlipidemia, unspecified: Secondary | ICD-10-CM

## 2019-05-22 MED ORDER — PRALUENT 150 MG/ML ~~LOC~~ SOAJ: 150.0000 mg | SUBCUTANEOUS | 11 refills | Status: DC

## 2019-05-22 NOTE — Telephone Encounter (Signed)
° °  Pt c/o medication issue:  1. Name of Medication: Evolocumab (REPATHA SURECLICK) 878 MG/ML SOAJ  2. How are you currently taking this medication (dosage and times per day)? As directed  3. Are you having a reaction (difficulty breathing--STAT)? no  4. What is your medication issue? Pt states that his insurance company does not want to cover the repatha any longer. The pharmacy has tried to reach out to Dr. Claiborne Billings buy has not gotten any response.  Patient states he had the same issue earlier in the year, but thought it got resolved.   Patient is due to do an injection on Sunday. The pharmacy has a more affordable alternative for the patient if necessary.

## 2019-05-22 NOTE — Telephone Encounter (Signed)
Routed to [facility] 

## 2019-05-22 NOTE — Telephone Encounter (Signed)
LMOM; Praluent 150mg  Rx sent to replace Repatha SureClick 140mg   (per insurance preference)   Patient overdue to repeat fastin blood work. New orders for lipid panel mailed to patient as well.

## 2019-06-02 DIAGNOSIS — E785 Hyperlipidemia, unspecified: Secondary | ICD-10-CM | POA: Diagnosis not present

## 2019-06-08 LAB — HEPATIC FUNCTION PANEL
ALT: 38 IU/L (ref 0–44)
AST: 29 IU/L (ref 0–40)
Albumin: 4.6 g/dL (ref 3.8–4.9)
Alkaline Phosphatase: 58 IU/L (ref 39–117)
Bilirubin Total: 0.6 mg/dL (ref 0.0–1.2)
Bilirubin, Direct: 0.24 mg/dL (ref 0.00–0.40)
Total Protein: 6.7 g/dL (ref 6.0–8.5)

## 2019-06-08 LAB — LIPID PANEL
Chol/HDL Ratio: 2.2 ratio (ref 0.0–5.0)
Cholesterol, Total: 67 mg/dL — ABNORMAL LOW (ref 100–199)
HDL: 30 mg/dL — ABNORMAL LOW (ref >39)
LDL Calculated: 0 mg/dL (ref 0–99)
Triglycerides: 184 mg/dL — ABNORMAL HIGH (ref 0–149)
VLDL Cholesterol Cal: 37 mg/dL (ref 5–40)

## 2019-07-08 DIAGNOSIS — E039 Hypothyroidism, unspecified: Secondary | ICD-10-CM | POA: Diagnosis not present

## 2019-07-08 DIAGNOSIS — I2510 Atherosclerotic heart disease of native coronary artery without angina pectoris: Secondary | ICD-10-CM | POA: Diagnosis not present

## 2019-07-08 DIAGNOSIS — I119 Hypertensive heart disease without heart failure: Secondary | ICD-10-CM | POA: Diagnosis not present

## 2019-07-08 DIAGNOSIS — Z125 Encounter for screening for malignant neoplasm of prostate: Secondary | ICD-10-CM | POA: Diagnosis not present

## 2019-07-08 DIAGNOSIS — Z23 Encounter for immunization: Secondary | ICD-10-CM | POA: Diagnosis not present

## 2019-07-08 DIAGNOSIS — Z6835 Body mass index (BMI) 35.0-35.9, adult: Secondary | ICD-10-CM | POA: Diagnosis not present

## 2019-07-08 DIAGNOSIS — E7800 Pure hypercholesterolemia, unspecified: Secondary | ICD-10-CM | POA: Diagnosis not present

## 2019-07-20 DIAGNOSIS — M545 Low back pain: Secondary | ICD-10-CM | POA: Diagnosis not present

## 2019-07-23 ENCOUNTER — Other Ambulatory Visit: Payer: Self-pay

## 2019-07-23 DIAGNOSIS — Z79899 Other long term (current) drug therapy: Secondary | ICD-10-CM

## 2019-07-23 DIAGNOSIS — E785 Hyperlipidemia, unspecified: Secondary | ICD-10-CM

## 2019-07-27 DIAGNOSIS — M545 Low back pain: Secondary | ICD-10-CM | POA: Diagnosis not present

## 2019-07-28 ENCOUNTER — Telehealth: Payer: Self-pay

## 2019-07-28 DIAGNOSIS — E785 Hyperlipidemia, unspecified: Secondary | ICD-10-CM

## 2019-07-28 DIAGNOSIS — Z79899 Other long term (current) drug therapy: Secondary | ICD-10-CM

## 2019-07-28 NOTE — Telephone Encounter (Signed)
Left detailed message for pt. Pt needs to come in for re-draw for lipid panel. This was [facility] mistake they were supposed to re-calculate since the LDL value on the lipid panel was 0. The [place] at their office did not do this. They re-calculated the total cholesterol not the LDL.  Spoke with the [place] here in our office ans she states that she will keep an eye out for when pt comes in for when the pt comes in and will make sure to do a no charge draw fee for pt since this was the the pt's fault. She will make sure that pt's result come back correctly after his lab draw.  Will keep an eye out for this and CB pt if he has not cam in a timely manner for this labwork.

## 2019-07-29 DIAGNOSIS — M545 Low back pain: Secondary | ICD-10-CM | POA: Diagnosis not present

## 2019-07-31 NOTE — Telephone Encounter (Signed)
LM2CB-COME IN ASAP

## 2019-08-03 DIAGNOSIS — E785 Hyperlipidemia, unspecified: Secondary | ICD-10-CM | POA: Diagnosis not present

## 2019-08-03 DIAGNOSIS — Z79899 Other long term (current) drug therapy: Secondary | ICD-10-CM | POA: Diagnosis not present

## 2019-08-04 LAB — LIPID PANEL
Chol/HDL Ratio: 2 ratio (ref 0.0–5.0)
Cholesterol, Total: 59 mg/dL — ABNORMAL LOW (ref 100–199)
HDL: 30 mg/dL — ABNORMAL LOW (ref >39)
LDL Chol Calc (NIH): 6 mg/dL (ref 0–99)
Triglycerides: 128 mg/dL (ref 0–149)
VLDL Cholesterol Cal: 23 mg/dL (ref 5–40)

## 2019-08-04 NOTE — Telephone Encounter (Signed)
Pt was here for lab draw yesterday afternoon

## 2019-08-12 ENCOUNTER — Telehealth: Payer: Self-pay | Admitting: Cardiovascular Disease

## 2019-08-12 NOTE — Telephone Encounter (Signed)
    Please return call with lab results 

## 2019-08-12 NOTE — Telephone Encounter (Signed)
Notes recorded by Troy Sine, MD on 08/09/2019 at 11:36 AM EDT  LDL now 6. Is patient on Repatha? If so, may be able to DC Zetia or slightly reduced atorvastatin. Pt is not taking Repatha he is taking Praluent 150mg  Q 2 weeks.  He will stop Zetia and continue Atorva and Praluent  Message sent to TK, lipid again in 3 months

## 2019-11-23 ENCOUNTER — Telehealth: Payer: Self-pay

## 2019-11-23 MED ORDER — PRALUENT 150 MG/ML ~~LOC~~ SOAJ: 150.0000 mg | SUBCUTANEOUS | 11 refills | Status: DC

## 2019-11-23 NOTE — Telephone Encounter (Signed)
LMOMED THE PT THAT PRALUENT WAS APPROVED AND REFILL SENT

## 2020-03-03 DIAGNOSIS — R197 Diarrhea, unspecified: Secondary | ICD-10-CM | POA: Diagnosis not present

## 2020-07-07 DIAGNOSIS — E039 Hypothyroidism, unspecified: Secondary | ICD-10-CM | POA: Diagnosis not present

## 2020-07-07 DIAGNOSIS — E7800 Pure hypercholesterolemia, unspecified: Secondary | ICD-10-CM | POA: Diagnosis not present

## 2020-07-07 DIAGNOSIS — I2510 Atherosclerotic heart disease of native coronary artery without angina pectoris: Secondary | ICD-10-CM | POA: Diagnosis not present

## 2020-07-07 DIAGNOSIS — I119 Hypertensive heart disease without heart failure: Secondary | ICD-10-CM | POA: Diagnosis not present

## 2020-07-08 ENCOUNTER — Telehealth: Payer: Self-pay | Admitting: Pharmacist

## 2020-07-08 DIAGNOSIS — E785 Hyperlipidemia, unspecified: Secondary | ICD-10-CM

## 2020-07-08 NOTE — Telephone Encounter (Signed)
Current lipid management therapy:  Praluent 150mg  every 14 days Atorvastatin 80mg  daily  ezetimbe - d/c'd Oct/2020 after LDL of 6mg /dL   Lipid panel from PCP at [facility]) on 07/07/20 shows :  CHO 164, TG 200, HDL 31, LDL-c 98  Patient taking medication as above? Using Praluent every 14 days?  LMOM; patient is to call back and discuss therapeutic options.

## 2020-07-11 MED ORDER — EZETIMIBE 10 MG PO TABS: 10.0000 mg | ORAL_TABLET | Freq: Every day | ORAL | 1 refills | Status: DC

## 2020-07-11 NOTE — Telephone Encounter (Signed)
Called and spoke w/wife stated that we really needed to talk w/mr [place]bee and that she would have him call us back asap

## 2020-07-11 NOTE — Telephone Encounter (Signed)
Pt called in and stated that they were taking the atorvastatin and the praluent. I mentioned to the pt that ldl was elevated to a 98. I consulted a pharmacist on what to do and Gregory Nixon stated that we should have the pt go back on the zetia. rx sent to the pharmacy on file and the pt was informed to complete fasting labs which were also ordered in 2 months.

## 2020-07-11 NOTE — Addendum Note (Signed)
Addended by: Eather Colas on: 07/11/2020 03:19 PM   Modules accepted: Orders

## 2020-07-13 MED ORDER — PRALUENT 150 MG/ML ~~LOC~~ SOAJ: 150.0000 mg | SUBCUTANEOUS | 3 refills | Status: DC

## 2020-07-13 NOTE — Telephone Encounter (Signed)
Returned call to pt to clarify medication. Pt voiced understanding

## 2020-07-13 NOTE — Addendum Note (Signed)
Addended by: Eather Colas on: 07/13/2020 04:59 PM   Modules accepted: Orders

## 2020-07-13 NOTE — Telephone Encounter (Signed)
Gregory Nixon is calling requesting someone call him to discuss the medication changes. He states there is a confusion somewhere. The pharmacy advises he isn't able to get the new prescription until 09/30. Please advise.

## 2020-07-23 DIAGNOSIS — Z23 Encounter for immunization: Secondary | ICD-10-CM | POA: Diagnosis not present

## 2020-08-22 DIAGNOSIS — E785 Hyperlipidemia, unspecified: Secondary | ICD-10-CM | POA: Diagnosis not present

## 2020-08-22 LAB — HEPATIC FUNCTION PANEL
ALT: 32 IU/L (ref 0–44)
AST: 25 IU/L (ref 0–40)
Albumin: 4.4 g/dL (ref 3.8–4.9)
Alkaline Phosphatase: 58 IU/L (ref 44–121)
Bilirubin Total: 0.5 mg/dL (ref 0.0–1.2)
Bilirubin, Direct: 0.16 mg/dL (ref 0.00–0.40)
Total Protein: 6.5 g/dL (ref 6.0–8.5)

## 2020-08-22 LAB — LIPID PANEL
Chol/HDL Ratio: 5.9 ratio — ABNORMAL HIGH (ref 0.0–5.0)
Cholesterol, Total: 147 mg/dL (ref 100–199)
HDL: 25 mg/dL — ABNORMAL LOW (ref >39)
LDL Chol Calc (NIH): 90 mg/dL (ref 0–99)
Triglycerides: 186 mg/dL — ABNORMAL HIGH (ref 0–149)
VLDL Cholesterol Cal: 32 mg/dL (ref 5–40)

## 2020-12-28 DIAGNOSIS — J069 Acute upper respiratory infection, unspecified: Secondary | ICD-10-CM | POA: Diagnosis not present

## 2021-01-20 ENCOUNTER — Other Ambulatory Visit: Payer: Self-pay | Admitting: Cardiovascular Disease

## 2021-01-31 ENCOUNTER — Ambulatory Visit: Payer: BC Managed Care – PPO | Admitting: Cardiovascular Disease

## 2021-01-31 ENCOUNTER — Telehealth: Payer: Self-pay

## 2021-01-31 ENCOUNTER — Encounter: Payer: Self-pay | Admitting: Cardiovascular Disease

## 2021-01-31 ENCOUNTER — Other Ambulatory Visit: Payer: Self-pay

## 2021-01-31 VITALS — BP 125/73 | HR 63 | Ht 67.0 in | Wt 233.4 lb

## 2021-01-31 DIAGNOSIS — I10 Essential (primary) hypertension: Secondary | ICD-10-CM

## 2021-01-31 DIAGNOSIS — E668 Other obesity: Secondary | ICD-10-CM

## 2021-01-31 DIAGNOSIS — I2510 Atherosclerotic heart disease of native coronary artery without angina pectoris: Secondary | ICD-10-CM | POA: Diagnosis not present

## 2021-01-31 DIAGNOSIS — E785 Hyperlipidemia, unspecified: Secondary | ICD-10-CM | POA: Diagnosis not present

## 2021-01-31 DIAGNOSIS — Z79899 Other long term (current) drug therapy: Secondary | ICD-10-CM | POA: Diagnosis not present

## 2021-01-31 MED ORDER — PRALUENT 150 MG/ML ~~LOC~~ SOAJ: 150.0000 mg | SUBCUTANEOUS | 3 refills | Status: DC

## 2021-01-31 NOTE — Patient Instructions (Signed)
Medication Instructions:  The current medical regimen is effective;  continue present plan and medications.  *If you need a refill on your cardiac medications before your next appointment, please call your pharmacy*   Lab Work: Fasting lab work (CBC, CMET, TSH, LIPID)  If you have labs (blood work) drawn today and your tests are completely normal, you will receive your results only by: . MyChart Message (if you have MyChart) OR . A paper copy in the mail If you have any lab test that is abnormal or we need to change your treatment, we will call you to review the results.   Follow-Up: At [facility], you and your health needs are our priority.  As part of our continuing mission to provide you with exceptional heart care, we have created designated Provider Care Teams.  These Care Teams include your primary Cardiologist (physician) and Advanced Practice Providers (APPs -  Physician Assistants and Nurse Practitioners) who all work together to provide you with the care you need, when you need it.  We recommend signing up for the patient portal called "MyChart".  Sign up information is provided on this After Visit Summary.  MyChart is used to connect with patients for Virtual Visits (Telemedicine).  Patients are able to view lab/test results, encounter notes, upcoming appointments, etc.  Non-urgent messages can be sent to your provider as well.   To learn more about what you can do with MyChart, go to [email or web address].    Your next appointment:   12 month(s)  The format for your next appointment:   In Person  Provider:   Mike Kelly, MD      

## 2021-01-31 NOTE — Progress Notes (Signed)
Patient ID: Gregory Nixon, male   DOB: [birth year], 55 y.o.   MRN: [id number]      HPI: [name] is a 55 year old male  presents to the office for a 50-monthfollow-up cardiology evaluation.   Mr. FHolzheimerhas a strong family history for CAD in both his mother who died at age 489with a massive heart attack and his father who is status post CABG revascularization surgery. His sister  has had multiple stents placed in her coronary arteries. The patient has a several year history of hypertension as well as hyperlipidemia.  In October 2014 he developed progressive exertional substernal chest tightness associated with left arm radiation and exertionally precipitated shortness of breath.  He saw Dr. EMarisue Humbleand was prescribed isosorbide 30 mg. I saw him for initial evaluation on 08/18/2013 and was concerned that his symptoms represented progressive accelerated angina pectoris. I added beta blocker and further titrated his isosorbide therapy. Cardiac catheterization on 08/24/2013 via the right radial artery approach revealed severe three-vessel coronary artery disease. His LAD had mild 30% narrowing before the diagonal vessel but after the diagonal vessel the LAD had a 99% eccentric focal stenosis. The proximal circumflex artery had an 80% smooth focal eccentric stenosis before the takeoff of the first marginal branch and there was a 50% stenosis in the midportion of the marginal vessel. The RCA was diffusely diseased and the mid RCA was completely occluded. There were bridging collaterals extending to the PDA. The distal RCA was occluded after the PDA takeoff and he did have some distal collateralization via left coronary circulation. Ejection fraction on ventriculography was 55%. I performed successful two-vessel coronary intervention and stent to the 99% mid LAD stenosis with a 2.75x12 mm Promus premier DES stent and the left circumflex coronary artery stenosis with a 3.5x15 mm sites expedition DES stent.  He was discharged the following day on increased medical regimen.  He underwent a nuclear perfusion study on 06/22/2015.  This was normal and revealed normal perfusion without evidence for prior infarction or evidence for ischemia.  Ejection fraction was 57% and he had normal wall motion.  He had issues with kidney stones and underwent cystoscopy with retrograde pyelogram with right ureteroscopic, basket extraction and double-J stent in December 2016.  I last saw him in June 2019 and since his prior evaluation of January 2018 he had remained fairly stable and denied any chest pain or exertional dyspnea.  He was not routinely exercising but typically was walking approximately 2 days/week.   He works at [employer]  He denies any palpitations PND orthopnea.  He has not been successful with weight loss.  Since his coronary intervention he has remained stable.  He has been maintained on atorvastatin 80 mg, Zetia 10 mg, and fenofibrate 160 mg for hyperlipidemia.  He has not had lipid studies done since his primary physician check this in June 2018 and LDL at that time was elevated at 122.  He continued to be on isosorbide 30 mg daily, metoprolol 25 twice a day, losartan 50 mg for hypertension and his CAD.  He is on aspirin 81 mg and was no longer on Brilinta.   Since I last saw him almost 3 years ago, he denies any chest pain or shortness of breath.  He continues to work at [employer]  Apparently he had been taking PCSK9 inhibition with Praluent but it appears that since the new year has come he never had his ReoPro result process undertaken.  As result he states he is not taking any Praluent throughout 2022.  He has continued to be on Zetia 10 mg and atorvastatin 80 mg daily.  For blood pressure and CAD he continues to be on isosorbide 30 mg, losartan 50 mg, metoprolol tartrate 25 mg twice a day in addition to baby aspirin.  He presents for evaluation.  Past Medical History:  Diagnosis Date   . Arthritis    hands  . At risk for sleep apnea    STOP-BANG= 5     SENT TO PCP 09-29-2015  . Coronary artery disease cardiologist-  dr Shelva Majestic   08-24-2013 s/p  PCI and DES x2  to LAD and pCFX    . Family history of premature CAD   . History of gout    lower extremities-- per pt no issues since 2014  . Hypertension   . Right ureteral stone   . S/P drug eluting coronary stent placement    08-24-2013  to LAD and pCFX    Past Surgical History:  Procedure Laterality Date  . CARDIOVASCULAR STRESS TEST  06-21-2015   dr  Shelva Majestic   normal nuclear study/  no ischemia or scar/ normal LV function and wall motion , ef 57%  . CYSTECTOMY  age 19   left leg  . CYSTOSCOPY WITH RETROGRADE PYELOGRAM, URETEROSCOPY AND STENT PLACEMENT Right 10/04/2015   Procedure: CYSTOSCOPY WITH RETROGRADE PYELOGRAM WITH INTERPERTATION RIGHT URETEROSCOPY  BASKET EXTRACTION DOUBLE J STENT;  Surgeon: Festus Aloe, MD;  Location: [facility];  Service: Urology;  Laterality: Right;  . HOLMIUM LASER APPLICATION Right 25/03/3892   Procedure: HOLMIUM LASER APPLICATION;  Surgeon: Festus Aloe, MD;  Location: [facility];  Service: Urology;  Laterality: Right;  . INGUINAL HERNIA REPAIR  09/04/2011   Procedure: LAPAROSCOPIC INGUINAL HERNIA;  Surgeon: Pedro Earls, MD;  Location: [facility];  Service: General;  Laterality: Right;  . INGUINAL HERNIA REPAIR  09/04/2011   Procedure: HERNIA REPAIR INGUINAL ADULT;  Surgeon: Pedro Earls, MD;  Location: [facility];  Service: General;  Laterality: N/A;  . LEFT AND RIGHT HEART CATHETERIZATION WITH CORONARY ANGIOGRAM N/A 08/24/2013   Procedure: LEFT AND RIGHT HEART CATHETERIZATION WITH CORONARY ANGIOGRAM;  Surgeon: Troy Sine, MD;  Location: [facility] CATH LAB;  Service: Cardiovascular;  Laterality: N/A;  . PERCUTANEOUS CORONARY STENT INTERVENTION (PCI-S) N/A 08/24/2013   Procedure: PERCUTANEOUS CORONARY STENT INTERVENTION (PCI-S);  Surgeon:  Troy Sine, MD;  Location: [facility] CATH LAB;  Service: Cardiovascular;  Laterality: N/A;   Severe 3 vessel CAD/  Total occlusion mRCA and dRCA with collaterals ,  DEStenting to LAD and pCFX,  ef 55%  . REPAIR RIGHT SCROTAL HERNIA  05-30-2011    No Known Allergies  Current Outpatient Medications  Medication Sig Dispense Refill  . aspirin EC 81 MG tablet Take 81 mg by mouth daily.    Marland Kitchen atorvastatin (LIPITOR) 80 MG tablet Take 1 tablet (80 mg total) by mouth daily at 6 PM. (Patient taking differently: Take 80 mg by mouth every morning.) 30 tablet 5  . ezetimibe (ZETIA) 10 MG tablet Take 1 tablet (10 mg total) by mouth daily. Please schedule appointment for future refills. 30 tablet 0  . fenofibrate 160 MG tablet Take 160 mg by mouth every morning.     Marland Kitchen ibuprofen (ADVIL,MOTRIN) 200 MG tablet Take 400 mg by mouth every 6 (six) hours as needed (for pain.).    Marland Kitchen isosorbide mononitrate (IMDUR) 30 MG 24  hr tablet Take 1 tablet (30 mg total) by mouth daily. 30 tablet 7  . losartan (COZAAR) 50 MG tablet Take 50 mg by mouth every morning.     . metoprolol tartrate (LOPRESSOR) 25 MG tablet Take 1 tablet (25 mg total) by mouth 2 (two) times daily. 60 tablet 3  . nitroGLYCERIN (NITROSTAT) 0.4 MG SL tablet PLACE 1 TABLET (0.4 MG TOTAL) UNDER THE TONGUE EVERY 5 (FIVE) MINUTES AS NEEDED FOR CHEST PAIN. 25 tablet 3  . Alirocumab (PRALUENT) 150 MG/ML SOAJ Inject 150 mg into the skin every 14 (fourteen) days. 6 mL 3   No current facility-administered medications for this visit.    Social history is notable in that he is married for 9 years. He works as a Presenter, broadcasting. He completed 12th grade of education. He has one child age 40-2 does not routinely exercise but previously had been trying to walk intermittently. There is no tobacco history. He seldom drinks beer or wine.  Family History  Problem Relation Age of Onset  . Heart disease Mother   . Heart disease Father   . Thyroid disease Father   .  Heart disease Sister   . Thyroid disease Sister   . Diabetes Sister     His father passed away 1 week after undergoing PVD intervention.    ROS General: Negative; No fevers, chills, or night sweats; positive for mild obesity HEENT: Negative; No changes in vision or hearing, sinus congestion, difficulty swallowing Pulmonary: Negative; No cough, wheezing, shortness of breath, hemoptysis Cardiovascular: See history of present illness  GI: Negative; No nausea, vomiting, diarrhea, or abdominal pain GU: Negative; No dysuria, hematuria, or difficulty voiding Musculoskeletal: Negative; no myalgias, joint pain, or weakness Hematologic/Oncology: Negative; no easy bruising, bleeding Endocrine: Negative; no heat/cold intolerance; no diabetes Neuro: Negative; no changes in balance, headaches Skin: Negative; No rashes or skin lesions Psychiatric: Negative; No behavioral problems, depression Sleep: Negative; No snoring, daytime sleepiness, hypersomnolence, bruxism, restless legs, hypnogognic hallucinations, no cataplexy Other comprehensive 14 point system review is negative.   PE BP 125/73   Pulse 63   Ht '5\' 7"'  (1.702 m)   Wt 233 lb 6.4 oz (105.9 kg)   SpO2 95%   BMI 36.56 kg/m    Repeat blood pressure by me was 114/70  Wt Readings from Last 3 Encounters:  01/31/21 233 lb 6.4 oz (105.9 kg)  04/22/18 221 lb 3.2 oz (100.3 kg)  11/27/16 209 lb (94.8 kg)   General: Alert, oriented, no distress.  Skin: normal turgor, no rashes, warm and dry HEENT: Normocephalic, atraumatic. Pupils equal round and reactive to light; sclera anicteric; extraocular muscles intact;  Nose without nasal septal hypertrophy Mouth/Parynx benign; Mallinpatti scale 3 Neck: No JVD, no carotid bruits; normal carotid upstroke Lungs: clear to ausculatation and percussion; no wheezing or rales Chest wall: without tenderness to palpitation Heart: PMI not displaced, RRR, s1 s2 normal, 1/6 systolic murmur, no diastolic  murmur, no rubs, gallops, thrills, or heaves Abdomen: soft, nontender; no hepatosplenomehaly, BS+; abdominal aorta nontender and not dilated by palpation. Back: no CVA tenderness Pulses 2+ Musculoskeletal: full range of motion, normal strength, no joint deformities Extremities: no clubbing cyanosis or edema, Homan's sign negative  Neurologic: grossly nonfocal; Cranial nerves grossly wnl Psychologic: Normal mood and affect   ECG (independently read by me): NSR at 63; no ectopy, normal intervals  June 2019 ECG (independently read by me): Normal sinus rhythm at 76 bpm.  No ectopy.  Normal intervals.  No  ST segment changes.  January 2018 ECG (independently read by me): Normal sinus rhythm at 65 bpm.  No ectopy.  Normal intervals.  February 2016 ECG (independently read by me): Normal sinus rhythm at 62 bpm.  Normal ECG.  Normal intervals.  June 2015 ECG (independently read by me): Sinus bradycardia 49 beats per minute.  No ectopy.  Normal intervals.  Mild T-wave change in lead 1 and aVL.  ECG: Normal sinus rhythm at 62 beats per minute. QTc interval 403 ms. PR interval 164 ms.  LABS: BMP Latest Ref Rng & Units 01/31/2021 05/05/2018 09/12/2015  Glucose 65 - 99 mg/dL 86 84 121(H)  BUN 6 - 24 mg/dL '13 16 20  ' Creatinine 0.76 - 1.27 mg/dL 0.98 0.93 1.52(H)  BUN/Creat Ratio 9 - '20 13 17 ' -  Sodium 134 - 144 mmol/L 138 140 137  Potassium 3.5 - 5.2 mmol/L 4.7 4.9 3.9  Chloride 96 - 106 mmol/L 102 102 101  CO2 20 - 29 mmol/L '22 22 25  ' Calcium 8.7 - 10.2 mg/dL 9.8 9.9 9.8   Hepatic Function Latest Ref Rng & Units 01/31/2021 08/22/2020 06/02/2019  Total Protein 6.0 - 8.5 g/dL 6.9 6.5 6.7  Albumin 3.8 - 4.9 g/dL 4.4 4.4 4.6  AST 0 - 40 IU/L '30 25 29  ' ALT 0 - 44 IU/L 42 32 38  Alk Phosphatase 44 - 121 IU/L 60 58 58  Total Bilirubin 0.0 - 1.2 mg/dL 0.5 0.5 0.6  Bilirubin, Direct 0.00 - 0.40 mg/dL - 0.16 0.24   CBC Latest Ref Rng & Units 01/31/2021 05/05/2018 09/12/2015  WBC 3.4 - 10.8 x10E3/uL 5.7 5.0  14.7(H)  Hemoglobin 13.0 - 17.7 g/dL 15.4 15.5 15.1  Hematocrit 37.5 - 51.0 % 44.5 45.1 41.5  Platelets 150 - 450 x10E3/uL 230 210 225   Lab Results  Component Value Date   MCV 86 01/31/2021   MCV 88 05/05/2018   MCV 84.9 09/12/2015   Lab Results  Component Value Date   TSH 3.230 01/31/2021   Lab Results  Component Value Date   HGBA1C 4.9 07/28/2012   Lipid Panel     Component Value Date/Time   CHOL 174 01/31/2021 0902   TRIG 216 (H) 01/31/2021 0902   HDL 28 (L) 01/31/2021 0902   CHOLHDL 6.2 (H) 01/31/2021 0902   CHOLHDL 6.8 12/31/2014 1615   VLDL 38 12/31/2014 1615   LDLCALC 108 (H) 01/31/2021 0902    RADIOLOGY: No results found.  IMPRESSION:  1. Hyperlipidemia LDL goal <70   2. CAD in native artery   3. Essential hypertension   4. Medication management   5. Moderate obesity     ASSESSMENT AND PLAN: Mr. Hussam Muniz is a 55 year old gentleman with cardiac risk factors notable for hypertension, hyperlipidemia, and strong family history for premature coronary artery disease in both parents as well as in his sister. He developed fairly classic exertionally precipitated substernal chest pain associated with exertional dyspnea.  In October 2014 he was found to have severe three-vessel disease with total occlusion of his mid RCA with both antegrade collaterals to the PDA system but also retrograde collaterals supplying the distal RCA beyond the PDA takeoff. He underwent two-vessel coronary intervention to a 99% mid LAD stenosis and a 80% proximal circumflex stenosis with drug-eluting stents.  He had initially been on DAPT with aspirin/Brilinta and transiently on low-dose Brilinta but over the last several years has just been on aspirin alone.  His blood pressure today is stable isosorbide 30 mg,  losartan 50 mg and metoprolol tartrate 25 mg twice a day.  He had continued to have LDL levels elevated despite Zetia and atorvastatin for this reason was ultimately approved for PCSK9  inhibition with Praluent.  Apparently states he has not taken any Praluent in 6773 since he is uncertain if he ever had the reapproval for the new year.  I discussed this with Racquel our pharmacist in the office today.  We provided him with 150 mg sample of Praluent to reinitiate therapy and we again will reinitiate the process for his approval therapy.  He also has continued to be on fenofibrate with his mixed hyperlipidemic pattern.  BMI continues to be elevated at 36.6 consistent with moderate obesity.  I again discussed the importance of weight loss and exercise he sees Dr. Gaynelle Arabian for his primary care he will have a follow-up Pharm-D evaluation following reinstitution of PCSK9 inhibition.  I will see him in 1 year for follow-up evaluation   Troy Sine, MD, [facility] 02/02/2021 4:29 PM

## 2021-01-31 NOTE — Telephone Encounter (Signed)
lmom for pt to call back asap

## 2021-02-01 LAB — LIPID PANEL
Chol/HDL Ratio: 6.2 ratio — ABNORMAL HIGH (ref 0.0–5.0)
Cholesterol, Total: 174 mg/dL (ref 100–199)
HDL: 28 mg/dL — ABNORMAL LOW (ref >39)
LDL Chol Calc (NIH): 108 mg/dL — ABNORMAL HIGH (ref 0–99)
Triglycerides: 216 mg/dL — ABNORMAL HIGH (ref 0–149)
VLDL Cholesterol Cal: 38 mg/dL (ref 5–40)

## 2021-02-01 LAB — CBC
Hematocrit: 44.5 % (ref 37.5–51.0)
Hemoglobin: 15.4 g/dL (ref 13.0–17.7)
MCH: 29.9 pg (ref 26.6–33.0)
MCHC: 34.6 g/dL (ref 31.5–35.7)
MCV: 86 fL (ref 79–97)
Platelets: 230 10*3/uL (ref 150–450)
RBC: 5.15 x10E6/uL (ref 4.14–5.80)
RDW: 12.3 % (ref 11.6–15.4)
WBC: 5.7 10*3/uL (ref 3.4–10.8)

## 2021-02-01 LAB — COMPREHENSIVE METABOLIC PANEL
ALT: 42 IU/L (ref 0–44)
AST: 30 IU/L (ref 0–40)
Albumin/Globulin Ratio: 1.8 (ref 1.2–2.2)
Albumin: 4.4 g/dL (ref 3.8–4.9)
Alkaline Phosphatase: 60 IU/L (ref 44–121)
BUN/Creatinine Ratio: 13 (ref 9–20)
BUN: 13 mg/dL (ref 6–24)
Bilirubin Total: 0.5 mg/dL (ref 0.0–1.2)
CO2: 22 mmol/L (ref 20–29)
Calcium: 9.8 mg/dL (ref 8.7–10.2)
Chloride: 102 mmol/L (ref 96–106)
Creatinine, Ser: 0.98 mg/dL (ref 0.76–1.27)
Globulin, Total: 2.5 g/dL (ref 1.5–4.5)
Glucose: 86 mg/dL (ref 65–99)
Potassium: 4.7 mmol/L (ref 3.5–5.2)
Sodium: 138 mmol/L (ref 134–144)
Total Protein: 6.9 g/dL (ref 6.0–8.5)
eGFR: 91 mL/min/{1.73_m2} (ref >59)

## 2021-02-01 LAB — TSH: TSH: 3.23 u[IU]/mL (ref 0.450–4.500)

## 2021-02-02 ENCOUNTER — Encounter: Payer: Self-pay | Admitting: Cardiovascular Disease

## 2021-02-13 ENCOUNTER — Telehealth: Payer: Self-pay | Admitting: Cardiovascular Disease

## 2021-02-13 NOTE — Telephone Encounter (Signed)
Labs from 01/31/21 pending MD review

## 2021-02-13 NOTE — Telephone Encounter (Signed)
    Patient calling for lab results 

## 2021-02-14 ENCOUNTER — Other Ambulatory Visit: Payer: Self-pay | Admitting: Cardiovascular Disease

## 2021-02-17 NOTE — Telephone Encounter (Signed)
Pt is returning call.  

## 2021-02-17 NOTE — Telephone Encounter (Signed)
Pt updated with lab results along with MD's recommendations and verbalized understanding.

## 2021-02-17 NOTE — Telephone Encounter (Signed)
Left message to call back  

## 2021-03-03 DIAGNOSIS — R42 Dizziness and giddiness: Secondary | ICD-10-CM | POA: Diagnosis not present

## 2021-03-17 ENCOUNTER — Encounter: Payer: Self-pay | Admitting: Neurology

## 2021-05-03 ENCOUNTER — Ambulatory Visit: Payer: BC Managed Care – PPO | Admitting: Neurology

## 2021-07-05 DIAGNOSIS — E039 Hypothyroidism, unspecified: Secondary | ICD-10-CM | POA: Diagnosis not present

## 2021-07-05 DIAGNOSIS — I2510 Atherosclerotic heart disease of native coronary artery without angina pectoris: Secondary | ICD-10-CM | POA: Diagnosis not present

## 2021-07-05 DIAGNOSIS — I119 Hypertensive heart disease without heart failure: Secondary | ICD-10-CM | POA: Diagnosis not present

## 2021-07-05 DIAGNOSIS — Z125 Encounter for screening for malignant neoplasm of prostate: Secondary | ICD-10-CM | POA: Diagnosis not present

## 2021-07-05 DIAGNOSIS — Z23 Encounter for immunization: Secondary | ICD-10-CM | POA: Diagnosis not present

## 2021-07-05 DIAGNOSIS — E7800 Pure hypercholesterolemia, unspecified: Secondary | ICD-10-CM | POA: Diagnosis not present

## 2021-07-06 DIAGNOSIS — Z03818 Encounter for observation for suspected exposure to other biological agents ruled out: Secondary | ICD-10-CM | POA: Diagnosis not present

## 2021-07-06 DIAGNOSIS — Z209 Contact with and (suspected) exposure to unspecified communicable disease: Secondary | ICD-10-CM | POA: Diagnosis not present

## 2021-08-16 DIAGNOSIS — Z86010 Personal history of colonic polyps: Secondary | ICD-10-CM | POA: Diagnosis not present

## 2021-08-16 DIAGNOSIS — I2510 Atherosclerotic heart disease of native coronary artery without angina pectoris: Secondary | ICD-10-CM | POA: Diagnosis not present

## 2021-08-30 DIAGNOSIS — E039 Hypothyroidism, unspecified: Secondary | ICD-10-CM | POA: Diagnosis not present

## 2021-10-11 ENCOUNTER — Telehealth: Payer: Self-pay | Admitting: Cardiovascular Disease

## 2021-10-11 MED ORDER — NITROGLYCERIN 0.4 MG SL SUBL: 0.4000 mg | SUBLINGUAL_TABLET | SUBLINGUAL | 3 refills | Status: AC | PRN

## 2021-10-11 NOTE — Telephone Encounter (Signed)
°*  STAT* If patient is at the pharmacy, call can be transferred to refill team.   1. Which medications need to be refilled? (please list name of each medication and dose if known)  nitroGLYCERIN (NITROSTAT) 0.4 MG SL tablet  2. Which pharmacy/location (including street and city if local pharmacy) is medication to be sent to? [id number] [address], [place] [phone]  3. Do they need a 30 day or 90 day supply?  30 day supply

## 2021-12-01 DIAGNOSIS — D123 Benign neoplasm of transverse colon: Secondary | ICD-10-CM | POA: Diagnosis not present

## 2021-12-01 DIAGNOSIS — D122 Benign neoplasm of ascending colon: Secondary | ICD-10-CM | POA: Diagnosis not present

## 2021-12-01 DIAGNOSIS — K648 Other hemorrhoids: Secondary | ICD-10-CM | POA: Diagnosis not present

## 2021-12-01 DIAGNOSIS — D125 Benign neoplasm of sigmoid colon: Secondary | ICD-10-CM | POA: Diagnosis not present

## 2021-12-01 DIAGNOSIS — R0901 Asphyxia: Secondary | ICD-10-CM | POA: Diagnosis not present

## 2021-12-01 DIAGNOSIS — Z86010 Personal history of colonic polyps: Secondary | ICD-10-CM | POA: Diagnosis not present

## 2021-12-04 ENCOUNTER — Other Ambulatory Visit: Payer: Self-pay

## 2021-12-04 ENCOUNTER — Ambulatory Visit
Admission: RE | Admit: 2021-12-04 | Discharge: 2021-12-04 | Disposition: A | Discharge status: Home / Self Care | Payer: BC Managed Care – PPO | Source: Ambulatory Visit | Attending: Family Medicine | Admitting: Family Medicine

## 2021-12-04 ENCOUNTER — Other Ambulatory Visit: Payer: Self-pay | Admitting: Family Medicine

## 2021-12-04 DIAGNOSIS — R059 Cough, unspecified: Secondary | ICD-10-CM | POA: Diagnosis not present

## 2021-12-12 ENCOUNTER — Other Ambulatory Visit: Payer: Self-pay | Admitting: Family Medicine

## 2021-12-12 DIAGNOSIS — J929 Pleural plaque without asbestos: Secondary | ICD-10-CM

## 2021-12-20 ENCOUNTER — Ambulatory Visit
Admission: RE | Admit: 2021-12-20 | Discharge: 2021-12-20 | Disposition: A | Discharge status: Home / Self Care | Payer: BC Managed Care – PPO | Source: Ambulatory Visit | Attending: Family Medicine | Admitting: Family Medicine

## 2021-12-20 ENCOUNTER — Other Ambulatory Visit: Payer: Self-pay

## 2021-12-20 DIAGNOSIS — I2510 Atherosclerotic heart disease of native coronary artery without angina pectoris: Secondary | ICD-10-CM | POA: Diagnosis not present

## 2021-12-20 DIAGNOSIS — J929 Pleural plaque without asbestos: Secondary | ICD-10-CM

## 2021-12-20 DIAGNOSIS — M47814 Spondylosis without myelopathy or radiculopathy, thoracic region: Secondary | ICD-10-CM | POA: Diagnosis not present

## 2021-12-20 DIAGNOSIS — I898 Other specified noninfective disorders of lymphatic vessels and lymph nodes: Secondary | ICD-10-CM | POA: Diagnosis not present

## 2021-12-20 DIAGNOSIS — R918 Other nonspecific abnormal finding of lung field: Secondary | ICD-10-CM | POA: Diagnosis not present

## 2022-03-05 ENCOUNTER — Other Ambulatory Visit: Payer: Self-pay | Admitting: Cardiovascular Disease

## 2022-06-26 NOTE — Progress Notes (Unsigned)
Cardiology Office Note:    Date:  06/27/2022   ID:  Gregory Nixon, DOB February 06, 1966, MRN [id number]  PCP:  Gaynelle Arabian, [place] Cardiologist: Shelva Majestic, MD   Reason for visit: 1 year follow-up  History of Present Illness:    Gregory Nixon is a 56 y.o. male with a hx of CAD status post PCI to the LAD and left circumflex in 2014, hyperlipidemia, hypertension and obesity.  He last saw Dr. Claiborne Billings in April 2023.  He had no angina.  There was issue with PCSK9 approval -follow-up with our Pharm.D. was recommended.  Otherwise weight loss and exercise recommended.  Today, he feels well.  He is active at work, walks and does yardwork without chest pain or shortness of breath.  He states prior to his PCI, he felt chest pain radiating down his arm.  He denies leg swelling, PND or orthopnea.  No palpitations.  He has rare lightheadedness.  No syncope.  No bleeding issues with baby aspirin.      Past Medical History:  Diagnosis Date   Arthritis    hands   At risk for sleep apnea    STOP-BANG= 5     SENT TO PCP 09-29-2015   Coronary artery disease cardiologist-  dr Shelva Majestic   08-24-2013 s/p  PCI and DES x2  to LAD and pCFX     Family history of premature CAD    History of gout    lower extremities-- per pt no issues since 2014   Hypertension    Right ureteral stone    S/P drug eluting coronary stent placement    08-24-2013  to LAD and pCFX    Past Surgical History:  Procedure Laterality Date   CARDIOVASCULAR STRESS TEST  06-21-2015   dr  Shelva Majestic   normal nuclear study/  no ischemia or scar/ normal LV function and wall motion , ef 57%   CYSTECTOMY  age 35   left leg   CYSTOSCOPY WITH RETROGRADE PYELOGRAM, URETEROSCOPY AND STENT PLACEMENT Right 10/04/2015   Procedure: CYSTOSCOPY WITH RETROGRADE PYELOGRAM WITH INTERPERTATION RIGHT URETEROSCOPY  BASKET EXTRACTION DOUBLE J STENT;  Surgeon: Festus Aloe, MD;  Location: [facility];  Service:  Urology;  Laterality: Right;   HOLMIUM LASER APPLICATION Right [id number]   Procedure: HOLMIUM LASER APPLICATION;  Surgeon: Festus Aloe, MD;  Location: [facility];  Service: Urology;  Laterality: Right;   INGUINAL HERNIA REPAIR  09/04/2011   Procedure: LAPAROSCOPIC INGUINAL HERNIA;  Surgeon: Pedro Earls, MD;  Location: [facility];  Service: General;  Laterality: Right;   INGUINAL HERNIA REPAIR  09/04/2011   Procedure: HERNIA REPAIR INGUINAL ADULT;  Surgeon: Pedro Earls, MD;  Location: [facility];  Service: General;  Laterality: N/A;   LEFT AND RIGHT HEART CATHETERIZATION WITH CORONARY ANGIOGRAM N/A 08/24/2013   Procedure: LEFT AND RIGHT HEART CATHETERIZATION WITH CORONARY ANGIOGRAM;  Surgeon: Troy Sine, MD;  Location: [facility] CATH LAB;  Service: Cardiovascular;  Laterality: N/A;   PERCUTANEOUS CORONARY STENT INTERVENTION (PCI-S) N/A 08/24/2013   Procedure: PERCUTANEOUS CORONARY STENT INTERVENTION (PCI-S);  Surgeon: Troy Sine, MD;  Location: [facility] CATH LAB;  Service: Cardiovascular;  Laterality: N/A;   Severe 3 vessel CAD/  Total occlusion mRCA and dRCA with collaterals ,  DEStenting to LAD and pCFX,  ef 55%   REPAIR RIGHT SCROTAL HERNIA  05-30-2011    Current Medications: Current Meds  Medication Sig   aspirin EC 81 MG tablet  Take 81 mg by mouth daily.   atorvastatin (LIPITOR) 80 MG tablet Take 1 tablet (80 mg total) by mouth daily at 6 PM. (Patient taking differently: Take 80 mg by mouth every morning.)   ezetimibe (ZETIA) 10 MG tablet TAKE 1 TABLET BY MOUTH EVERY DAY   fenofibrate 160 MG tablet Take 160 mg by mouth every morning.    ibuprofen (ADVIL,MOTRIN) 200 MG tablet Take 400 mg by mouth every 6 (six) hours as needed (for pain.).   levothyroxine (SYNTHROID) 88 MCG tablet Take 88 mcg by mouth every morning.   losartan (COZAAR) 50 MG tablet Take 50 mg by mouth every morning.    metoprolol tartrate (LOPRESSOR) 25 MG tablet Take 1 tablet (25 mg total) by mouth 2 (two)  times daily.   nitroGLYCERIN (NITROSTAT) 0.4 MG SL tablet Place 1 tablet (0.4 mg total) under the tongue every 5 (five) minutes as needed for chest pain.   [DISCONTINUED] isosorbide mononitrate (IMDUR) 30 MG 24 hr tablet Take 1 tablet (30 mg total) by mouth daily.     Allergies:   Patient has no known allergies.   Social History   Socioeconomic History   Marital status: Married    Spouse name: Not on file   Number of children: Not on file   Years of education: Not on file   Highest education level: Not on file  Occupational History   Not on file  Tobacco Use   Smoking status: Never   Smokeless tobacco: Never  Substance and Sexual Activity   Alcohol use: No   Drug use: No   Sexual activity: Not on file  Other Topics Concern   Not on file  Social History Narrative   Not on file   Social Determinants of Health   Financial Resource Strain: Not on file  Food Insecurity: Not on file  Transportation Needs: Not on file  Physical Activity: Not on file  Stress: Not on file  Social Connections: Not on file     Family History: The patient's family history includes Diabetes in his sister; Heart disease in his father, mother, and sister; Thyroid disease in his father and sister.  ROS:   Please see the history of present illness.     EKGs/Labs/Other Studies Reviewed:    EKG:  The ekg ordered today demonstrates sinus bradycardia with heart rate 57, low voltage QRS.  Recent Labs: No results found for requested labs within last 365 days.   Recent Lipid Panel Lab Results  Component Value Date/Time   CHOL 174 01/31/2021 09:02 AM   TRIG 216 (H) 01/31/2021 09:02 AM   HDL 28 (L) 01/31/2021 09:02 AM   LDLCALC 108 (H) 01/31/2021 09:02 AM    Physical Exam:    VS:  BP 122/78   Pulse (!) 57   Ht 5\' 7"  (1.702 m)   Wt 232 lb (105.2 kg)   SpO2 97%   BMI 36.34 kg/m    No data found.       Wt Readings from Last 3 Encounters:  06/27/22 232 lb (105.2 kg)  01/31/21 233 lb 6.4  oz (105.9 kg)  04/22/18 221 lb 3.2 oz (100.3 kg)     GEN:  Well nourished, well developed in no acute distress, overweight HEENT: Normal NECK: No JVD; No carotid bruits CARDIAC: RRR, no murmurs, rubs, gallops RESPIRATORY:  Clear to auscultation without rales, wheezing or rhonchi  ABDOMEN: Soft, non-tender, non-distended MUSCULOSKELETAL: No edema; No deformity  SKIN: Warm and dry NEUROLOGIC:  Alert  and oriented PSYCHIATRIC:  Normal affect     ASSESSMENT AND PLAN   Coronary artery disease, no angina -PCI to LAD and left circumflex in 2014 -Okay to stop Imdur without angina. -Continue beta-blocker therapy.  If patient continues not to have angina at next appointment, consider discontinue beta-blocker therapy.  Recent evidence shows that beta-blockers may have limited benefit to patients without angina, decreased EF or MI within the last year.  Patient has mild sinus bradycardia with heart rate 57. -Continue aspirin 81 mg daily. -Continue lipid therapy.  Hypertension, well controlled -Continue losartan 50 mg daily.  Check kidney function and potassium today. -Monitor blood pressure off Imdur. -Goal BP is <130/80.  Recommend DASH diet (high in vegetables, fruits, low-fat dairy products, whole grains, poultry, fish, and nuts and low in sweets, sugar-sweetened beverages, and red meats), salt restriction and increase physical activity.  Hyperlipidemia with goal LDL less than 70 -LDL 105 last September.  Recheck fasting lipids today. -He states he was able to get Repatha for limited time previously that controlled his lipids well.  Then there was an insurance issue and he had to switch to [employer] -which his pharmacy was not able to obtain due to supply issues. -Continue Lipitor 80 mg, Zetia 10 mg and fenofibrate 160 mg for now. -If lipids continue to be elevated at this check, will plan to prescribe Repatha/Praluent and stop Zetia.  -Discussed cholesterol lowering diets - Mediterranean  diet, DASH diet, vegetarian diet, low-carbohydrate diet and avoidance of trans fats.  Discussed healthier choice substitutes.  Nuts, high-fiber foods, and fiber supplements may also improve lipids.    Obesity -Discussed how even a 5-10% weight loss can have cardiovascular benefits.   -Recommend moderate intensity activity for 30 minutes 5 days/week and the DASH diet.     Disposition - Follow-up in 1 year with Dr. Claiborne Billings.   Medication Adjustments/Labs and Tests Ordered: Current medicines are reviewed at length with the patient today.  Concerns regarding medicines are outlined above.  Orders Placed This Encounter  Procedures   Lipid panel   Comprehensive metabolic panel   EKG [id number]   No orders of the defined types were placed in this encounter.   Patient Instructions  Medication Instructions:  Stop Imdur. *If you need a refill on your cardiac medications before your next appointment, please call your pharmacy*   Lab Work: Lipid Panel,CMET. today If you have labs (blood work) drawn today and your tests are completely normal, you will receive your results only by: [place] (if you have MyChart) OR A paper copy in the mail If you have any lab test that is abnormal or we need to change your treatment, we will call you to review the results.   Testing/Procedures: No Testing   Follow-Up: At [facility], you and your health needs are our priority.  As part of our continuing mission to provide you with exceptional heart care, we have created designated Provider Care Teams.  These Care Teams include your primary Cardiologist (physician) and Advanced Practice Providers (APPs -  Physician Assistants and Nurse Practitioners) who all work together to provide you with the care you need, when you need it.  We recommend signing up for the patient portal called "MyChart".  Sign up information is provided on this After Visit Summary.  MyChart is used to connect with patients  for Virtual Visits (Telemedicine).  Patients are able to view lab/test results, encounter notes, upcoming appointments, etc.  Non-urgent messages can be sent to  your provider as well.   To learn more about what you can do with MyChart, go to [email or web address].    Your next appointment:   1 year(s)  The format for your next appointment:   In Person  Provider:   [name], MD         Signed, Cannon Kettle, PA-C  06/27/2022 8:41 AM    [facility]

## 2022-06-27 ENCOUNTER — Ambulatory Visit: Payer: BC Managed Care – PPO | Attending: Physician Assistant | Admitting: Physician Assistant

## 2022-06-27 ENCOUNTER — Encounter: Payer: Self-pay | Admitting: Physician Assistant

## 2022-06-27 VITALS — BP 122/78 | HR 57 | Ht 67.0 in | Wt 232.0 lb

## 2022-06-27 DIAGNOSIS — E668 Other obesity: Secondary | ICD-10-CM | POA: Diagnosis not present

## 2022-06-27 DIAGNOSIS — I2510 Atherosclerotic heart disease of native coronary artery without angina pectoris: Secondary | ICD-10-CM | POA: Diagnosis not present

## 2022-06-27 DIAGNOSIS — E785 Hyperlipidemia, unspecified: Secondary | ICD-10-CM

## 2022-06-27 DIAGNOSIS — I10 Essential (primary) hypertension: Secondary | ICD-10-CM | POA: Diagnosis not present

## 2022-06-27 LAB — COMPREHENSIVE METABOLIC PANEL
ALT: 35 IU/L (ref 0–44)
AST: 28 IU/L (ref 0–40)
Albumin/Globulin Ratio: 1.9 (ref 1.2–2.2)
Albumin: 4.6 g/dL (ref 3.8–4.9)
Alkaline Phosphatase: 62 IU/L (ref 44–121)
BUN/Creatinine Ratio: 14 (ref 9–20)
BUN: 14 mg/dL (ref 6–24)
Bilirubin Total: 0.7 mg/dL (ref 0.0–1.2)
CO2: 23 mmol/L (ref 20–29)
Calcium: 9.8 mg/dL (ref 8.7–10.2)
Chloride: 103 mmol/L (ref 96–106)
Creatinine, Ser: 1 mg/dL (ref 0.76–1.27)
Globulin, Total: 2.4 g/dL (ref 1.5–4.5)
Glucose: 99 mg/dL (ref 70–99)
Potassium: 4.6 mmol/L (ref 3.5–5.2)
Sodium: 140 mmol/L (ref 134–144)
Total Protein: 7 g/dL (ref 6.0–8.5)
eGFR: 88 mL/min/{1.73_m2} (ref >59)

## 2022-06-27 LAB — LIPID PANEL
Chol/HDL Ratio: 7.7 ratio — ABNORMAL HIGH (ref 0.0–5.0)
Cholesterol, Total: 192 mg/dL (ref 100–199)
HDL: 25 mg/dL — ABNORMAL LOW (ref >39)
LDL Chol Calc (NIH): 122 mg/dL — ABNORMAL HIGH (ref 0–99)
Triglycerides: 255 mg/dL — ABNORMAL HIGH (ref 0–149)
VLDL Cholesterol Cal: 45 mg/dL — ABNORMAL HIGH (ref 5–40)

## 2022-06-27 NOTE — Patient Instructions (Signed)
Medication Instructions:  Stop Imdur. *If you need a refill on your cardiac medications before your next appointment, please call your pharmacy*   Lab Work: Lipid Panel,CMET. today If you have labs (blood work) drawn today and your tests are completely normal, you will receive your results only by: MyChart Message (if you have MyChart) OR A paper copy in the mail If you have any lab test that is abnormal or we need to change your treatment, we will call you to review the results.   Testing/Procedures: No Testing   Follow-Up: At [facility], you and your health needs are our priority.  As part of our continuing mission to provide you with exceptional heart care, we have created designated Provider Care Teams.  These Care Teams include your primary Cardiologist (physician) and Advanced Practice Providers (APPs -  Physician Assistants and Nurse Practitioners) who all work together to provide you with the care you need, when you need it.  We recommend signing up for the patient portal called "MyChart".  Sign up information is provided on this After Visit Summary.  MyChart is used to connect with patients for Virtual Visits (Telemedicine).  Patients are able to view lab/test results, encounter notes, upcoming appointments, etc.  Non-urgent messages can be sent to your provider as well.   To learn more about what you can do with MyChart, go to [email or web address].    Your next appointment:   1 year(s)  The format for your next appointment:   In Person  Provider:   [name], MD

## 2022-07-03 ENCOUNTER — Telehealth: Payer: Self-pay

## 2022-07-03 ENCOUNTER — Telehealth: Payer: Self-pay | Admitting: Cardiovascular Disease

## 2022-07-03 NOTE — Telephone Encounter (Addendum)
Called patient regarding results. Left message.----- Message from Cannon Kettle, PA-C sent at 07/02/2022  4:46 PM EDT ----- Kidney function, liver function & electrolytes normal.

## 2022-07-03 NOTE — Telephone Encounter (Signed)
Pt returning call regarding results. Please advise 

## 2022-07-03 NOTE — Telephone Encounter (Addendum)
Called patient regarding results. Left message for patient to call office.----- Message from Cannon Kettle, PA-C sent at 07/02/2022  4:46 PM EDT ----- Cholesterol elevated with LDL (bad cholesterol) 122.  Goal LDL less than 70.  Recommend starting Repatha subcutaneous 420 mg once a month.   Judeth Cornfield - please send to his pharmacy with 1 year supply. Check lipids in 3 months.  (At that time, we may be able to stop Zetia)

## 2022-07-03 NOTE — Telephone Encounter (Signed)
Called patient, advised of result for lab work.   It was recommended to start Repatha- however, patient states he was taking Praulent (not sure which one was preferred with insurance team) I will route to pharmacy team to start Repatha.   Thank you!

## 2022-07-04 ENCOUNTER — Other Ambulatory Visit: Payer: Self-pay

## 2022-07-04 ENCOUNTER — Telehealth: Payer: Self-pay

## 2022-07-04 DIAGNOSIS — E785 Hyperlipidemia, unspecified: Secondary | ICD-10-CM

## 2022-07-04 MED ORDER — PRALUENT 150 MG/ML ~~LOC~~ SOAJ
1.0000 | SUBCUTANEOUS | 3 refills | Status: DC
Start: 2022-07-04 — End: 2022-07-30

## 2022-07-04 NOTE — Telephone Encounter (Signed)
Spoke with patient, advised of plan below.He states the Praulent just felt like I did not work well- we will send in [place] [employer] (sent to pharmacy) as PA was approved. Patient aware we will recheck blood work in 3 months (ordered) and if no changes we could possibly switch to Repatha (as we would have lab data that medication did not work. Patient verbalized understanding.   Thanks!  Judeth Cornfield- lab result completed.  Thanks!

## 2022-07-04 NOTE — Telephone Encounter (Addendum)
Called patient regarding results. Patient had understanding of results.----- Message from Cannon Kettle, PA-C sent at 07/02/2022  4:46 PM EDT ----- Cholesterol elevated with LDL (bad cholesterol) 122.  Goal LDL less than 70.  Recommend starting Repatha subcutaneous 420 mg once a month.   Judeth Cornfield - please send to his pharmacy with 1 year supply. Check lipids in 3 months.  (At that time, we may be able to stop Zetia)

## 2022-07-04 NOTE — Telephone Encounter (Signed)
His Praluent PA is still good until January 2024.  Call and have him re-start that.  Praluent 150 mg qs q14d.  Ok to call in refills x 1 year if he needs it.  Repeat labs after 5-6 doses (about 3 months)

## 2022-07-18 ENCOUNTER — Telehealth: Payer: Self-pay | Admitting: Cardiovascular Disease

## 2022-07-18 NOTE — Telephone Encounter (Signed)
Pt c/o medication issue:  1. Name of Medication: repatha or praluent  2. How are you currently taking this medication (dosage and times per day)? Not taking  3. Are you having a reaction (difficulty breathing--STAT)? no  4. What is your medication issue? Patient states he was told he would need to take one of the medications, but has not heard anything yet.

## 2022-07-18 NOTE — Telephone Encounter (Signed)
Returned call to patient-advised per previous message it was recommended he restart Praluent (PA approved through 10/2022) and was sent to [facility] on 9/6.    Patient aware and verbalized understanding.

## 2022-07-27 DIAGNOSIS — E7800 Pure hypercholesterolemia, unspecified: Secondary | ICD-10-CM | POA: Diagnosis not present

## 2022-07-27 DIAGNOSIS — E039 Hypothyroidism, unspecified: Secondary | ICD-10-CM | POA: Diagnosis not present

## 2022-07-27 DIAGNOSIS — Z23 Encounter for immunization: Secondary | ICD-10-CM | POA: Diagnosis not present

## 2022-07-27 DIAGNOSIS — I2510 Atherosclerotic heart disease of native coronary artery without angina pectoris: Secondary | ICD-10-CM | POA: Diagnosis not present

## 2022-07-27 DIAGNOSIS — I119 Hypertensive heart disease without heart failure: Secondary | ICD-10-CM | POA: Diagnosis not present

## 2022-07-30 ENCOUNTER — Other Ambulatory Visit: Payer: Self-pay | Admitting: Cardiovascular Disease

## 2022-07-30 ENCOUNTER — Telehealth: Payer: Self-pay | Admitting: Cardiovascular Disease

## 2022-07-30 DIAGNOSIS — E7800 Pure hypercholesterolemia, unspecified: Secondary | ICD-10-CM

## 2022-07-30 DIAGNOSIS — I2510 Atherosclerotic heart disease of native coronary artery without angina pectoris: Secondary | ICD-10-CM

## 2022-07-30 MED ORDER — PRALUENT 150 MG/ML ~~LOC~~ SOAJ: 1.0000 | SUBCUTANEOUS | 3 refills | Status: DC

## 2022-07-30 NOTE — Telephone Encounter (Signed)
Returned the call to the patient. He stated that he was supposed to start Praluent but his pharmacy never got the refill. He has been advised that [facility] confirmed the refill on 9/6. He again stated that they did not get it. It has been resent for him.

## 2022-07-30 NOTE — Telephone Encounter (Signed)
Patient called requesting to talk with Dr. Claiborne Billings or nurse. Please call back

## 2022-09-24 DIAGNOSIS — E039 Hypothyroidism, unspecified: Secondary | ICD-10-CM | POA: Diagnosis not present

## 2023-01-09 DIAGNOSIS — Z86010 Personal history of colonic polyps: Secondary | ICD-10-CM | POA: Diagnosis not present

## 2023-02-12 DIAGNOSIS — J069 Acute upper respiratory infection, unspecified: Secondary | ICD-10-CM | POA: Diagnosis not present

## 2023-02-12 DIAGNOSIS — R059 Cough, unspecified: Secondary | ICD-10-CM | POA: Diagnosis not present

## 2023-02-17 ENCOUNTER — Other Ambulatory Visit: Payer: Self-pay | Admitting: Cardiovascular Disease

## 2023-04-05 DIAGNOSIS — D123 Benign neoplasm of transverse colon: Secondary | ICD-10-CM | POA: Diagnosis not present

## 2023-04-05 DIAGNOSIS — K648 Other hemorrhoids: Secondary | ICD-10-CM | POA: Diagnosis not present

## 2023-04-05 DIAGNOSIS — D125 Benign neoplasm of sigmoid colon: Secondary | ICD-10-CM | POA: Diagnosis not present

## 2023-04-05 DIAGNOSIS — Z86010 Personal history of colonic polyps: Secondary | ICD-10-CM | POA: Diagnosis not present

## 2023-05-03 ENCOUNTER — Encounter: Payer: Self-pay | Admitting: Nurse Practitioner

## 2023-05-03 ENCOUNTER — Ambulatory Visit (INDEPENDENT_AMBULATORY_CARE_PROVIDER_SITE_OTHER): Payer: BC Managed Care – PPO | Admitting: Nurse Practitioner

## 2023-05-03 VITALS — BP 142/80 | HR 57 | Ht 67.0 in | Wt 234.8 lb

## 2023-05-03 DIAGNOSIS — E669 Obesity, unspecified: Secondary | ICD-10-CM | POA: Diagnosis not present

## 2023-05-03 DIAGNOSIS — R0683 Snoring: Secondary | ICD-10-CM | POA: Diagnosis not present

## 2023-05-03 DIAGNOSIS — R0681 Apnea, not elsewhere classified: Secondary | ICD-10-CM | POA: Diagnosis not present

## 2023-05-03 NOTE — Progress Notes (Signed)
@Patient  ID: Gregory Nixon, male    DOB: 01-15-1966, 57 y.o.   MRN: [id number]  Chief Complaint  Patient presents with   Consult    Pt does snore, pt wife states he stops breathing while sleep. Never had sleep study, pt does not experience daytime sleepiness. Pt does have family history of sleep apnea    Referring provider: Blair Heys, MD  HPI: 57 year old male, never smoker referred for sleep consult. Past medical history significant for HTN, unstable angina, CAD, gout, HLD, obesity.  TEST/EVENTS:   05/03/2023: Today - sleep consult Patient presents today for sleep consult, referred by Dr. Manus Gunning. He has been told by his wife that he snores loudly and stops breathing. He has some fatigue at the end of the day but otherwise feels okay. He tends to toss and turn at night. Denies any drowsy driving, morning headaches, sleep parasomnias/paralysis. No history of narcolepsy or symptoms of cataplexy.  He goes to bed between 1030-11 pm. Falls asleep within 10-15 minutes. Wakes 2-3 times a night. Gets up around 5 am. He is a Control and instrumentation engineer. Never fallen asleep or felt drowsy while performing job duties. Weight is down 5 lb over the past 2 years. Never had a previous sleep study. No O2 use. He has a history of HTN, unstable angina, CAD s/p PCI. No history of MI, stroke, DM. He has a family history of sleep apnea.  He is a never smoker. Seldomly drinks alcohol. No excessive caffeine intake. Lives with his wife.   Epworth 10  No Known Allergies  Immunization History  Administered Date(s) Administered   Influenza-Unspecified 08/11/2013    Past Medical History:  Diagnosis Date   Arthritis    hands   At risk for sleep apnea    STOP-BANG= 5     SENT TO PCP 09-29-2015   Coronary artery disease cardiologist-  dr [name]   08-24-2013 s/p  PCI and DES x2  to LAD and pCFX     Family history of premature CAD    History of gout    lower extremities-- per pt no issues since 2014    Hypertension    Right ureteral stone    S/P drug eluting coronary stent placement    08-24-2013  to LAD and pCFX    Tobacco History: Social History   Tobacco Use  Smoking Status Never  Smokeless Tobacco Never   Counseling given: Not Answered   Outpatient Medications Prior to Visit  Medication Sig Dispense Refill   aspirin EC 81 MG tablet Take 81 mg by mouth daily.     atorvastatin (LIPITOR) 80 MG tablet Take 1 tablet (80 mg total) by mouth daily at 6 PM. (Patient taking differently: Take 80 mg by mouth every morning.) 30 tablet 5   Evolocumab 140 MG/ML SOAJ Inject 1 mL into the skin every 14 (fourteen) days. 2 mL 11   ezetimibe (ZETIA) 10 MG tablet TAKE 1 TABLET BY MOUTH EVERY DAY 90 tablet 3   fenofibrate 160 MG tablet Take 160 mg by mouth every morning.      ibuprofen (ADVIL,MOTRIN) 200 MG tablet Take 400 mg by mouth every 6 (six) hours as needed (for pain.).     levothyroxine (SYNTHROID) 88 MCG tablet Take 88 mcg by mouth every morning.     losartan (COZAAR) 50 MG tablet Take 50 mg by mouth every morning.      metoprolol tartrate (LOPRESSOR) 25 MG tablet Take 1 tablet (25 mg total)  by mouth 2 (two) times daily. 60 tablet 3   nitroGLYCERIN (NITROSTAT) 0.4 MG SL tablet Place 1 tablet (0.4 mg total) under the tongue every 5 (five) minutes as needed for chest pain. 25 tablet 3   No facility-administered medications prior to visit.     Review of Systems:   Constitutional: No night sweats, fevers, chills,or lassitude. +occasional daytime fatigue, weight loss (intentional) HEENT: No headaches, difficulty swallowing, tooth/dental problems, or sore throat. No sneezing, itching, ear ache, nasal congestion, or post nasal drip CV:  No chest pain, orthopnea, PND, swelling in lower extremities, anasarca, dizziness, palpitations, syncope Resp: +snoring, witnessed apneas. No shortness of breath with exertion or at rest. No excess mucus or change in color of mucus. No productive or  non-productive. No hemoptysis. No wheezing.  No chest wall deformity GI:  No heartburn, indigestion GU: No dysuria, change in color of urine, urgency or frequency.   Skin: No rash, lesions, ulcerations MSK:  No joint pain or swelling.   Neuro: No dizziness or lightheadedness.  Psych: No depression or anxiety. Mood stable. +sleep disturbance    Physical Exam:  BP (!) 142/80   Pulse (!) 57   Ht 5\' 7"  (1.702 m)   Wt 234 lb 12.8 oz (106.5 kg)   SpO2 97%   BMI 36.77 kg/m   GEN: Pleasant, interactive, well-appearing; obese; in no acute distress. HEENT:  Normocephalic and atraumatic. PERRLA. Sclera white. Nasal turbinates pink, moist and patent bilaterally. No rhinorrhea present. Oropharynx pink and moist, without exudate or edema. No lesions, ulcerations, or postnasal drip. Mallampati III NECK:  Supple w/ fair ROM. No JVD present. Normal carotid impulses w/o bruits. Thyroid symmetrical with no goiter or nodules palpated. No lymphadenopathy.   CV: RRR, no m/r/g, no peripheral edema. Pulses intact, +2 bilaterally. No cyanosis, pallor or clubbing. PULMONARY:  Unlabored, regular breathing. Clear bilaterally A&P w/o wheezes/rales/rhonchi. No accessory muscle use.  GI: BS present and normoactive. Soft, non-tender to palpation. No organomegaly or masses detected.  MSK: No erythema, warmth or tenderness. Cap refil <2 sec all extrem. No deformities or joint swelling noted.  Neuro: A/Ox3. No focal deficits noted.   Skin: Warm, no lesions or rashe Psych: Normal affect and behavior. Judgement and thought content appropriate.     Lab Results:  CBC    Component Value Date/Time   WBC 5.7 01/31/2021 0902   WBC 14.7 (H) 09/12/2015 0549   RBC 5.15 01/31/2021 0902   RBC 4.89 09/12/2015 0549   HGB 15.4 01/31/2021 0902   HCT 44.5 01/31/2021 0902   PLT 230 01/31/2021 0902   MCV 86 01/31/2021 0902   MCH 29.9 01/31/2021 0902   MCH 30.9 09/12/2015 0549   MCHC 34.6 01/31/2021 0902   MCHC 36.4 (H)  09/12/2015 0549   RDW 12.3 01/31/2021 0902   LYMPHSABS 1.0 09/12/2015 0549   MONOABS 1.4 (H) 09/12/2015 0549   EOSABS 0.0 09/12/2015 0549   BASOSABS 0.0 09/12/2015 0549    BMET    Component Value Date/Time   NA 140 06/27/2022 0845   K 4.6 06/27/2022 0845   CL 103 06/27/2022 0845   CO2 23 06/27/2022 0845   GLUCOSE 99 06/27/2022 0845   GLUCOSE 121 (H) 09/12/2015 0549   BUN 14 06/27/2022 0845   CREATININE 1.00 06/27/2022 0845   CREATININE 0.98 12/31/2014 1615   CALCIUM 9.8 06/27/2022 0845   GFRNONAA 94 05/05/2018 1121   GFRAA 109 05/05/2018 1121    BNP No results found for: "BNP"  Imaging:  No results found.        No data to display          No results found for: "NITRICOXIDE"      Assessment & Plan:   Loud snoring He has snoring, occasional daytime sleepiness, nocturnal apneic events. BMI 36. Epworth 10. Given this,  I am concerned he could have sleep disordered breathing with obstructive sleep apnea. He will need sleep study for further evaluation.    - discussed how weight can impact sleep and risk for sleep disordered breathing - discussed options to assist with weight loss: combination of diet modification, cardiovascular and strength training exercises   - had an extensive discussion regarding the adverse health consequences related to untreated sleep disordered breathing - specifically discussed the risks for hypertension, coronary artery disease, cardiac dysrhythmias, cerebrovascular disease, and diabetes - lifestyle modification discussed   - discussed how sleep disruption can increase risk of accidents, particularly when driving - safe driving practices were discussed  Patient Instructions  Given your symptoms, I am concerned that you may have sleep disordered breathing with sleep apnea. You will need a sleep study for further evaluation. Someone will contact you to schedule this.   We discussed how untreated sleep apnea puts an individual at  risk for cardiac arrhthymias, pulm HTN, DM, stroke and increases their risk for daytime accidents. We also briefly reviewed treatment options including weight loss, side sleeping position, oral appliance, CPAP therapy or referral to ENT for possible surgical options  Use caution when driving and pull over if you become sleepy.  Follow up in 6 weeks with Katie Kadiatou Oplinger,NP to go over sleep study results, or sooner, if needed     Obesity (BMI 30-39.9) BMI 36.7. Healthy weight loss encouraged.    I spent 35 minutes of dedicated to the care of this patient on the date of this encounter to include pre-visit review of records, face-to-face time with the patient discussing conditions above, post visit ordering of testing, clinical documentation with the electronic health record, making appropriate referrals as documented, and communicating necessary findings to members of the patients care team.  Noemi Chapel, NP 05/03/2023  Pt aware and understands NP's role.

## 2023-05-03 NOTE — Patient Instructions (Signed)
Given your symptoms, I am concerned that you may have sleep disordered breathing with sleep apnea. You will need a sleep study for further evaluation. Someone will contact you to schedule this.   We discussed how untreated sleep apnea puts an individual at risk for cardiac arrhthymias, pulm HTN, DM, stroke and increases their risk for daytime accidents. We also briefly reviewed treatment options including weight loss, side sleeping position, oral appliance, CPAP therapy or referral to ENT for possible surgical options  Use caution when driving and pull over if you become sleepy.  Follow up in 6 weeks with Gregory Fate Galanti,NP to go over sleep study results, or sooner, if needed   

## 2023-05-03 NOTE — Assessment & Plan Note (Signed)
He has snoring, occasional daytime sleepiness, nocturnal apneic events. BMI 36. Epworth 10. Given this,  I am concerned he could have sleep disordered breathing with obstructive sleep apnea. He will need sleep study for further evaluation.    - discussed how weight can impact sleep and risk for sleep disordered breathing - discussed options to assist with weight loss: combination of diet modification, cardiovascular and strength training exercises   - had an extensive discussion regarding the adverse health consequences related to untreated sleep disordered breathing - specifically discussed the risks for hypertension, coronary artery disease, cardiac dysrhythmias, cerebrovascular disease, and diabetes - lifestyle modification discussed   - discussed how sleep disruption can increase risk of accidents, particularly when driving - safe driving practices were discussed  Patient Instructions  Given your symptoms, I am concerned that you may have sleep disordered breathing with sleep apnea. You will need a sleep study for further evaluation. Someone will contact you to schedule this.   We discussed how untreated sleep apnea puts an individual at risk for cardiac arrhthymias, pulm HTN, DM, stroke and increases their risk for daytime accidents. We also briefly reviewed treatment options including weight loss, side sleeping position, oral appliance, CPAP therapy or referral to ENT for possible surgical options  Use caution when driving and pull over if you become sleepy.  Follow up in 6 weeks with Katie Marliss Buttacavoli,NP to go over sleep study results, or sooner, if needed

## 2023-05-03 NOTE — Assessment & Plan Note (Signed)
BMI 36.7. Healthy weight loss encouraged.

## 2023-05-29 ENCOUNTER — Ambulatory Visit (INDEPENDENT_AMBULATORY_CARE_PROVIDER_SITE_OTHER): Payer: BC Managed Care – PPO | Admitting: Nurse Practitioner

## 2023-05-29 DIAGNOSIS — R0681 Apnea, not elsewhere classified: Secondary | ICD-10-CM

## 2023-05-29 DIAGNOSIS — R0683 Snoring: Secondary | ICD-10-CM

## 2023-05-29 DIAGNOSIS — G4733 Obstructive sleep apnea (adult) (pediatric): Secondary | ICD-10-CM | POA: Diagnosis not present

## 2023-05-31 NOTE — Progress Notes (Signed)
Moderate OSA. Follow up scheduled 8/20. Will discuss then

## 2023-06-17 ENCOUNTER — Ambulatory Visit: Payer: BC Managed Care – PPO | Admitting: Nurse Practitioner

## 2023-06-18 ENCOUNTER — Ambulatory Visit: Payer: BC Managed Care – PPO | Admitting: Nurse Practitioner

## 2023-06-18 ENCOUNTER — Encounter: Payer: Self-pay | Admitting: Nurse Practitioner

## 2023-06-18 VITALS — BP 138/88 | HR 60 | Temp 98.1°F | Ht 67.0 in | Wt 237.8 lb

## 2023-06-18 DIAGNOSIS — E669 Obesity, unspecified: Secondary | ICD-10-CM

## 2023-06-18 DIAGNOSIS — G4733 Obstructive sleep apnea (adult) (pediatric): Secondary | ICD-10-CM

## 2023-06-18 NOTE — Progress Notes (Unsigned)
@Patient  ID: Gregory Nixon, male    DOB: Aug 26, 1966, 57 y.o.   MRN: [id number]  Chief Complaint  Patient presents with   Follow-up    Pt is here to discuss Sleep Study results. Pt has no complaints today.    Referring provider: Blair Heys, MD  HPI: 57 year old male, never smoker followed for sleep apnea. Past medical history significant for HTN, unstable angina, CAD, gout, HLD, obesity.  TEST/EVENTS:  05/14/2023 HST: AHI 25.7/h, SpO2 low 73%  05/03/2023: Ov with Kazuto Sevey NP for sleep consult, referred by Dr. Manus Gunning. He has been told by his wife that he snores loudly and stops breathing. He has some fatigue at the end of the day but otherwise feels okay. He tends to toss and turn at night. Denies any drowsy driving, morning headaches, sleep parasomnias/paralysis. No history of narcolepsy or symptoms of cataplexy.  He goes to bed between 1030-11 pm. Falls asleep within 10-15 minutes. Wakes 2-3 times a night. Gets up around 5 am. He is a Control and instrumentation engineer. Never fallen asleep or felt drowsy while performing job duties. Weight is down 5 lb over the past 2 years. Never had a previous sleep study. No O2 use. He has a history of HTN, unstable angina, CAD s/p PCI. No history of MI, stroke, DM. He has a family history of sleep apnea.  He is a never smoker. Seldomly drinks alcohol. No excessive caffeine intake. Lives with his wife.  Epworth 10  06/18/2023: Today - follow up Patient presents today for follow up after undergoing home sleep study, which revealed moderate sleep apnea. He feels unchanged compared to his last visit. He has restless sleep at night. Doesn't necessarily feel very tired during the day except in the afternoon. He denies drowsy driving or morning headaches. Wants to discuss treatment options.   No Known Allergies  Immunization History  Administered Date(s) Administered   Influenza-Unspecified 08/11/2013    Past Medical History:  Diagnosis Date   Arthritis    hands   At  risk for sleep apnea    STOP-BANG= 5     SENT TO PCP 09-29-2015   Coronary artery disease cardiologist-  dr [name]   08-24-2013 s/p  PCI and DES x2  to LAD and pCFX     Family history of premature CAD    History of gout    lower extremities-- per pt no issues since 2014   Hypertension    Right ureteral stone    S/P drug eluting coronary stent placement    08-24-2013  to LAD and pCFX    Tobacco History: Social History   Tobacco Use  Smoking Status Never  Smokeless Tobacco Never   Counseling given: Not Answered   Outpatient Medications Prior to Visit  Medication Sig Dispense Refill   aspirin EC 81 MG tablet Take 81 mg by mouth daily.     atorvastatin (LIPITOR) 80 MG tablet Take 1 tablet (80 mg total) by mouth daily at 6 PM. (Patient taking differently: Take 80 mg by mouth every morning.) 30 tablet 5   Evolocumab 140 MG/ML SOAJ Inject 1 mL into the skin every 14 (fourteen) days. 2 mL 11   ezetimibe (ZETIA) 10 MG tablet TAKE 1 TABLET BY MOUTH EVERY DAY 90 tablet 3   fenofibrate 160 MG tablet Take 160 mg by mouth every morning.      ibuprofen (ADVIL,MOTRIN) 200 MG tablet Take 400 mg by mouth every 6 (six) hours as needed (for pain.).  levothyroxine (SYNTHROID) 88 MCG tablet Take 88 mcg by mouth every morning.     losartan (COZAAR) 50 MG tablet Take 50 mg by mouth every morning.      metoprolol tartrate (LOPRESSOR) 25 MG tablet Take 1 tablet (25 mg total) by mouth 2 (two) times daily. 60 tablet 3   nitroGLYCERIN (NITROSTAT) 0.4 MG SL tablet Place 1 tablet (0.4 mg total) under the tongue every 5 (five) minutes as needed for chest pain. 25 tablet 3   No facility-administered medications prior to visit.     Review of Systems:   Constitutional: No night sweats, fevers, chills,or lassitude. +occasional daytime fatigue, weight loss (intentional) HEENT: No headaches, difficulty swallowing, tooth/dental problems, or sore throat. No sneezing, itching, ear ache, nasal congestion,  or post nasal drip CV:  No chest pain, orthopnea, PND, swelling in lower extremities, anasarca, dizziness, palpitations, syncope Resp: +snoring, witnessed apneas. No shortness of breath with exertion or at rest. No excess mucus or change in color of mucus. No productive or non-productive. No hemoptysis. No wheezing.  No chest wall deformity GI:  No heartburn, indigestion GU: No dysuria, change in color of urine, urgency or frequency.   Skin: No rash, lesions, ulcerations MSK:  No joint pain or swelling.   Neuro: No dizziness or lightheadedness.  Psych: No depression or anxiety. Mood stable. +sleep disturbance    Physical Exam:  BP 138/88 (BP Location: Right Arm, Cuff Size: Normal)   Pulse 60   Temp 98.1 F (36.7 C) (Oral)   Ht 5\' 7"  (1.702 m)   Wt 237 lb 12.8 oz (107.9 kg)   SpO2 97%   BMI 37.24 kg/m   GEN: Pleasant, interactive, well-appearing; obese; in no acute distress. HEENT:  Normocephalic and atraumatic. PERRLA. Sclera white. Nasal turbinates pink, moist and patent bilaterally. No rhinorrhea present. Oropharynx pink and moist, without exudate or edema. No lesions, ulcerations, or postnasal drip. Mallampati III NECK:  Supple w/ fair ROM. No JVD present. Normal carotid impulses w/o bruits. Thyroid symmetrical with no goiter or nodules palpated. No lymphadenopathy.   CV: RRR, no m/r/g, no peripheral edema. Pulses intact, +2 bilaterally. No cyanosis, pallor or clubbing. PULMONARY:  Unlabored, regular breathing. Clear bilaterally A&P w/o wheezes/rales/rhonchi. No accessory muscle use.  GI: BS present and normoactive. Soft, non-tender to palpation. No organomegaly or masses detected.  MSK: No erythema, warmth or tenderness. Cap refil <2 sec all extrem. No deformities or joint swelling noted.  Neuro: A/Ox3. No focal deficits noted.   Skin: Warm, no lesions or rashe Psych: Normal affect and behavior. Judgement and thought content appropriate.     Lab Results:  CBC    Component  Value Date/Time   WBC 5.7 01/31/2021 0902   WBC 14.7 (H) 09/12/2015 0549   RBC 5.15 01/31/2021 0902   RBC 4.89 09/12/2015 0549   HGB 15.4 01/31/2021 0902   HCT 44.5 01/31/2021 0902   PLT 230 01/31/2021 0902   MCV 86 01/31/2021 0902   MCH 29.9 01/31/2021 0902   MCH 30.9 09/12/2015 0549   MCHC 34.6 01/31/2021 0902   MCHC 36.4 (H) 09/12/2015 0549   RDW 12.3 01/31/2021 0902   LYMPHSABS 1.0 09/12/2015 0549   MONOABS 1.4 (H) 09/12/2015 0549   EOSABS 0.0 09/12/2015 0549   BASOSABS 0.0 09/12/2015 0549    BMET    Component Value Date/Time   NA 140 06/27/2022 0845   K 4.6 06/27/2022 0845   CL 103 06/27/2022 0845   CO2 23 06/27/2022 0845  GLUCOSE 99 06/27/2022 0845   GLUCOSE 121 (H) 09/12/2015 0549   BUN 14 06/27/2022 0845   CREATININE 1.00 06/27/2022 0845   CREATININE 0.98 12/31/2014 1615   CALCIUM 9.8 06/27/2022 0845   GFRNONAA 94 05/05/2018 1121   GFRAA 109 05/05/2018 1121    BNP No results found for: "BNP"   Imaging:  No results found.  Administration History     None           No data to display          No results found for: "NITRICOXIDE"      Assessment & Plan:   Moderate obstructive sleep apnea Moderate OSA with AHI 25.7/h, SpO2 low 73%. Reviewed risks of untreated OSA and potential treatment options. Shared decision to move forward with CPAP therapy. Orders placed for auto CPAP 5-15 cmH2O, mask of choice and heated humidity. Educated on proper use/care of device. Risks/benefits reviewed. Aware of safe driving practices. Healthy weight loss encouraged.   Patient Instructions  Start auto CPAP 5-15 cmH2O, mask of choice and heated humidity, every night, minimum of 4-6 hours a night.  Change equipment every as directed. Wash your tubing with warm soap and water daily, hang to dry. Wash humidifier portion weekly. Change water daily - bottled, distilled water  Be aware of reduced alertness and do not drive or operate heavy machinery if experiencing  this or drowsiness.  Exercise encouraged, as tolerated. Healthy weight management discussed.   We discussed how untreated sleep apnea puts an individual at risk for cardiac arrhthymias, pulm HTN, DM, stroke and increases their risk for daytime accidents. We also briefly reviewed treatment options including weight loss, side sleeping position, oral appliance, CPAP therapy or referral to ENT for possible surgical options  Follow up in 10-12 weeks with Dr. Wynona Neat or Philis Nettle, or sooner, if needed    Obesity (BMI 30-39.9) BMI 37. Healthy weight loss encouraged    I spent 35 minutes of dedicated to the care of this patient on the date of this encounter to include pre-visit review of records, face-to-face time with the patient discussing conditions above, post visit ordering of testing, clinical documentation with the electronic health record, making appropriate referrals as documented, and communicating necessary findings to members of the patients care team.  Noemi Chapel, NP 06/18/2023  Pt aware and understands NP's role.

## 2023-06-18 NOTE — Assessment & Plan Note (Addendum)
Moderate OSA with AHI 25.7/h, SpO2 low 73%. Reviewed risks of untreated OSA and potential treatment options. Shared decision to move forward with CPAP therapy. Orders placed for auto CPAP 5-15 cmH2O, mask of choice and heated humidity. Educated on proper use/care of device. Risks/benefits reviewed. Aware of safe driving practices. Healthy weight loss encouraged.   Patient Instructions  Start auto CPAP 5-15 cmH2O, mask of choice and heated humidity, every night, minimum of 4-6 hours a night.  Change equipment every as directed. Wash your tubing with warm soap and water daily, hang to dry. Wash humidifier portion weekly. Change water daily - bottled, distilled water  Be aware of reduced alertness and do not drive or operate heavy machinery if experiencing this or drowsiness.  Exercise encouraged, as tolerated. Healthy weight management discussed.   We discussed how untreated sleep apnea puts an individual at risk for cardiac arrhthymias, pulm HTN, DM, stroke and increases their risk for daytime accidents. We also briefly reviewed treatment options including weight loss, side sleeping position, oral appliance, CPAP therapy or referral to ENT for possible surgical options  Follow up in 10-12 weeks with Dr. Wynona Neat or Philis Nettle, or sooner, if needed

## 2023-06-18 NOTE — Assessment & Plan Note (Signed)
BMI 37. Healthy weight loss encouraged.  

## 2023-06-18 NOTE — Patient Instructions (Signed)
Start auto CPAP 5-15 cmH2O, mask of choice and heated humidity, every night, minimum of 4-6 hours a night.  Change equipment every as directed. Wash your tubing with warm soap and water daily, hang to dry. Wash humidifier portion weekly. Change water daily - bottled, distilled water  Be aware of reduced alertness and do not drive or operate heavy machinery if experiencing this or drowsiness.  Exercise encouraged, as tolerated. Healthy weight management discussed.   We discussed how untreated sleep apnea puts an individual at risk for cardiac arrhthymias, pulm HTN, DM, stroke and increases their risk for daytime accidents. We also briefly reviewed treatment options including weight loss, side sleeping position, oral appliance, CPAP therapy or referral to ENT for possible surgical options  Follow up in 10-12 weeks with Dr. Wynona Neat or Philis Nettle, or sooner, if needed

## 2023-07-10 DIAGNOSIS — G4733 Obstructive sleep apnea (adult) (pediatric): Secondary | ICD-10-CM | POA: Diagnosis not present

## 2023-07-17 ENCOUNTER — Encounter: Payer: Self-pay | Admitting: Nurse Practitioner

## 2023-08-02 DIAGNOSIS — Z125 Encounter for screening for malignant neoplasm of prostate: Secondary | ICD-10-CM | POA: Diagnosis not present

## 2023-08-02 DIAGNOSIS — E7800 Pure hypercholesterolemia, unspecified: Secondary | ICD-10-CM | POA: Diagnosis not present

## 2023-08-02 DIAGNOSIS — Z23 Encounter for immunization: Secondary | ICD-10-CM | POA: Diagnosis not present

## 2023-08-02 DIAGNOSIS — Z1159 Encounter for screening for other viral diseases: Secondary | ICD-10-CM | POA: Diagnosis not present

## 2023-08-02 DIAGNOSIS — E039 Hypothyroidism, unspecified: Secondary | ICD-10-CM | POA: Diagnosis not present

## 2023-08-02 DIAGNOSIS — Z0000 Encounter for general adult medical examination without abnormal findings: Secondary | ICD-10-CM | POA: Diagnosis not present

## 2023-08-02 DIAGNOSIS — I2510 Atherosclerotic heart disease of native coronary artery without angina pectoris: Secondary | ICD-10-CM | POA: Diagnosis not present

## 2023-08-02 DIAGNOSIS — I119 Hypertensive heart disease without heart failure: Secondary | ICD-10-CM | POA: Diagnosis not present

## 2023-08-03 LAB — LAB REPORT - SCANNED
Creatinine, POC: 156 mg/dL
EGFR: 91
Microalb Creat Ratio: 20.2
Microalbumin, Urine: 3.14

## 2023-08-09 DIAGNOSIS — G4733 Obstructive sleep apnea (adult) (pediatric): Secondary | ICD-10-CM | POA: Diagnosis not present

## 2023-08-22 ENCOUNTER — Ambulatory Visit: Payer: BC Managed Care – PPO | Attending: Cardiovascular Disease | Admitting: Cardiovascular Disease

## 2023-08-22 ENCOUNTER — Encounter: Payer: Self-pay | Admitting: Cardiovascular Disease

## 2023-08-22 DIAGNOSIS — Z955 Presence of coronary angioplasty implant and graft: Secondary | ICD-10-CM

## 2023-08-22 DIAGNOSIS — E039 Hypothyroidism, unspecified: Secondary | ICD-10-CM

## 2023-08-22 DIAGNOSIS — G4733 Obstructive sleep apnea (adult) (pediatric): Secondary | ICD-10-CM

## 2023-08-22 DIAGNOSIS — E66812 Obesity, class 2: Secondary | ICD-10-CM

## 2023-08-22 DIAGNOSIS — I10 Essential (primary) hypertension: Secondary | ICD-10-CM | POA: Diagnosis not present

## 2023-08-22 DIAGNOSIS — I2510 Atherosclerotic heart disease of native coronary artery without angina pectoris: Secondary | ICD-10-CM

## 2023-08-22 DIAGNOSIS — E782 Mixed hyperlipidemia: Secondary | ICD-10-CM

## 2023-08-22 MED ORDER — ICOSAPENT ETHYL 1 G PO CAPS: 2.0000 g | ORAL_CAPSULE | Freq: Two times a day (BID) | ORAL | 3 refills | Status: DC

## 2023-08-22 NOTE — Progress Notes (Signed)
Patient ID: CHE CISSE, male   DOB: 1966-01-29, 58 y.o.   MRN: [id number]        HPI: [name] is a 57 year old male  presents to the office for a 30 -month follow-up cardiology evaluation.   [name] has a strong family history for CAD in both his mother who died at age 60 with a massive heart attack and his father who is status post CABG revascularization surgery. His sister  has had multiple stents placed in her coronary arteries. The patient has a several year history of hypertension as well as hyperlipidemia.  In October 2014 he developed progressive exertional substernal chest tightness associated with left arm radiation and exertionally precipitated shortness of breath.  He saw Dr. Manus Nixon and was prescribed isosorbide 30 mg. I saw him for initial evaluation on 08/18/2013 and was concerned that his symptoms represented progressive accelerated angina pectoris. I added beta blocker and further titrated his isosorbide therapy. Cardiac catheterization on 08/24/2013 via the right radial artery approach revealed severe three-vessel coronary artery disease. His LAD had mild 30% narrowing before the diagonal vessel but after the diagonal vessel the LAD had a 99% eccentric focal stenosis. The proximal circumflex artery had an 80% smooth focal eccentric stenosis before the takeoff of the first marginal branch and there was a 50% stenosis in the midportion of the marginal vessel. The RCA was diffusely diseased and the mid RCA was completely occluded. There were bridging collaterals extending to the PDA. The distal RCA was occluded after the PDA takeoff and he did have some distal collateralization via left coronary circulation. Ejection fraction on ventriculography was 55%. I performed successful two-vessel coronary intervention and stent to the 99% mid LAD stenosis with a 2.75x12 mm Promus premier DES stent and the left circumflex coronary artery stenosis with a 3.5x15 mm sites expedition DES  stent. He was discharged the following day on increased medical regimen.  He underwent a nuclear perfusion study on 06/22/2015.  This was normal and revealed normal perfusion without evidence for prior infarction or evidence for ischemia.  Ejection fraction was 57% and he had normal wall motion.  He had issues with kidney stones and underwent cystoscopy with retrograde pyelogram with right ureteroscopic, basket extraction and double-J stent in December 2016.  I saw him in June 2019 and since his prior evaluation of January 2018 he had remained fairly stable and denied any chest pain or exertional dyspnea.  He was not routinely exercising but typically was walking approximately 2 days/week.   He works at [employer].  He denies any palpitations PND orthopnea.  He has not been successful with weight loss.  Since his coronary intervention he has remained stable.  He has been maintained on atorvastatin 80 mg, Zetia 10 mg, and fenofibrate 160 mg for hyperlipidemia.  He has not had lipid studies done since his primary physician check this in June 2018 and LDL at that time was elevated at 122.  He continued to be on isosorbide 30 mg daily, metoprolol 25 twice a day, losartan 50 mg for hypertension and his CAD.  He is on aspirin 81 mg and was no longer on Brilinta.   After not having seen him in almost 3 years, I last saw him on January 31, 2021.  At that time he denied any chest pain or shortness of breath.  He was continuing to work at [employer].  Apparently he had been taking PCSK9 inhibition with Praluent but it appears  that since the new year has come he never had his renewal process undertaken.  As result he states he is not taking any Praluent throughout 2022.  He has continued to be on Zetia 10 mg and atorvastatin 80 mg daily.  For blood pressure and CAD he continues to be on isosorbide 30 mg, losartan 50 mg, metoprolol tartrate 25 mg twice a day in addition to baby aspirin.   Since I last  saw him, he now has been on PCSK9 currently takes Repatha every 2-week injection in addition to atorvastatin 80 mg, Zetia 10 mg, and fenofibrate 160 mg for mixed hyperlipidemia.  He continues to be on losartan 50 mg of metoprolol tartrate 25 mg twice a day for his underlying CAD and hypertension.  He is on levothyroxine 88 mcg for hypothyroidism.  He is on baby aspirin.  He has obstructive sleep apnea which is followed predominantly by Gregory Radar, NP at [facility] pulmonary and uses CPAP.  His primary physician Dr. [name] has recently retired and he just established care with Dr. [name] at [place].  Presently, he denies any recurrent anginal symptomatology.  He had laboratory in August and cholesterol was 192 triglycerides 255 HDL 25 and LDL 122.  Most recent laboratory on August 02, 2023 showed total cholesterol 137, HDL 27, LDL 78, triglycerides 186.  He does not routinely exercise.  He presents for 2-1/2-year reevaluation.   Past Medical History:  Diagnosis Date   Arthritis    hands   At risk for sleep apnea    STOP-BANG= 5     SENT TO PCP 09-29-2015   Coronary artery disease cardiologist-  dr Gregory Nixon   08-24-2013 s/p  PCI and DES x2  to LAD and pCFX     Family history of premature CAD    History of gout    lower extremities-- per pt no issues since 2014   Hypertension    Right ureteral stone    S/P drug eluting coronary stent placement    08-24-2013  to LAD and pCFX    Past Surgical History:  Procedure Laterality Date   CARDIOVASCULAR STRESS TEST  06-21-2015   dr  Gregory Nixon   normal nuclear study/  no ischemia or scar/ normal LV function and wall motion , ef 57%   CYSTECTOMY  age 63   left leg   CYSTOSCOPY WITH RETROGRADE PYELOGRAM, URETEROSCOPY AND STENT PLACEMENT Right 10/04/2015   Procedure: CYSTOSCOPY WITH RETROGRADE PYELOGRAM WITH INTERPERTATION RIGHT URETEROSCOPY  BASKET EXTRACTION DOUBLE J STENT;  Surgeon: Gregory Field, MD;  [facility];   Service: Urology;  Laterality: Right;   HOLMIUM LASER APPLICATION Right 10/04/2015   Procedure: HOLMIUM LASER APPLICATION;  Surgeon: Gregory Field, MD;  Location: [facility];  Service: Urology;  Laterality: Right;   INGUINAL HERNIA REPAIR  09/04/2011   Procedure: LAPAROSCOPIC INGUINAL HERNIA;  Surgeon: Valarie Merino, MD;  Location: [facility];  Service: General;  Laterality: Right;   INGUINAL HERNIA REPAIR  09/04/2011   Procedure: HERNIA REPAIR INGUINAL ADULT;  Surgeon: Valarie Merino, MD;  Location: [facility];  Service: General;  Laterality: N/A;   LEFT AND RIGHT HEART CATHETERIZATION WITH CORONARY ANGIOGRAM N/A 08/24/2013   Procedure: LEFT AND RIGHT HEART CATHETERIZATION WITH CORONARY ANGIOGRAM;  Surgeon: Gregory Bihari, MD;  Location: [facility] CATH LAB;  Service: Cardiovascular;  Laterality: N/A;   PERCUTANEOUS CORONARY STENT INTERVENTION (PCI-S) N/A 08/24/2013   Procedure: PERCUTANEOUS CORONARY STENT INTERVENTION (PCI-S);  Surgeon: Maisie Fus  Alphonsus Sias, MD;  Location: [facility] CATH LAB;  Service: Cardiovascular;  Laterality: N/A;   Severe 3 vessel CAD/  Total occlusion mRCA and dRCA with collaterals ,  DEStenting to LAD and pCFX,  ef 55%   REPAIR RIGHT SCROTAL HERNIA  05-30-2011    No Known Allergies  Current Outpatient Medications  Medication Sig Dispense Refill   aspirin EC 81 MG tablet Take 81 mg by mouth daily.     atorvastatin (LIPITOR) 80 MG tablet Take 1 tablet (80 mg total) by mouth daily at 6 PM. (Patient taking differently: Take 80 mg by mouth every morning.) 30 tablet 5   Evolocumab 140 MG/ML SOAJ Inject 1 mL into the skin every 14 (fourteen) days. 2 mL 11   ezetimibe (ZETIA) 10 MG tablet TAKE 1 TABLET BY MOUTH EVERY DAY 90 tablet 3   fenofibrate 160 MG tablet Take 160 mg by mouth every morning.      ibuprofen (ADVIL,MOTRIN) 200 MG tablet Take 400 mg by mouth every 6 (six) hours as needed (for pain.).     icosapent Ethyl (VASCEPA) 1 g capsule Take 2 capsules (2 g total) by mouth 2  (two) times daily. 120 capsule 3   levothyroxine (SYNTHROID) 88 MCG tablet Take 88 mcg by mouth every morning.     losartan (COZAAR) 50 MG tablet Take 50 mg by mouth every morning.      metoprolol tartrate (LOPRESSOR) 25 MG tablet Take 1 tablet (25 mg total) by mouth 2 (two) times daily. 60 tablet 3   nitroGLYCERIN (NITROSTAT) 0.4 MG SL tablet Place 1 tablet (0.4 mg total) under the tongue every 5 (five) minutes as needed for chest pain. 25 tablet 3   No current facility-administered medications for this visit.    Social history is notable in that he is married for 9 years. He works as a Scientist, clinical (histocompatibility and immunogenetics). He completed 12th grade of education. He has one child age 29-2 does not routinely exercise but previously had been trying to walk intermittently. There is no tobacco history. He seldom drinks beer or wine.  Family History  Problem Relation Age of Onset   Heart disease Mother    Heart disease Father    Thyroid disease Father    Heart disease Sister    Thyroid disease Sister    Diabetes Sister     His father passed away 1 week after undergoing PVD intervention.    ROS General: Negative; No fevers, chills, or night sweats; positive for mild obesity HEENT: Negative; No changes in vision or hearing, sinus congestion, difficulty swallowing Pulmonary: Negative; No cough, wheezing, shortness of breath, hemoptysis Cardiovascular: See history of present illness  GI: Negative; No nausea, vomiting, diarrhea, or abdominal pain GU: Negative; No dysuria, hematuria, or difficulty voiding Musculoskeletal: Negative; no myalgias, joint pain, or weakness Hematologic/Oncology: Negative; no easy bruising, bleeding Endocrine: Negative; no heat/cold intolerance; no diabetes Neuro: Negative; no changes in balance, headaches Skin: Negative; No rashes or skin lesions Psychiatric: Negative; No behavioral problems, depression Sleep: OSA on CPAP, followed by pulmonary Other comprehensive 14 point system  review is negative.   PE BP 110/64   Pulse 66   Ht 5\' 7"  (1.702 m)   Wt 238 lb (108 kg)   SpO2 97%   BMI 37.28 kg/m    Repeat blood pressure by me was 118/70  Wt Readings from Last 3 Encounters:  08/22/23 238 lb (108 kg)  06/18/23 237 lb 12.8 oz (107.9 kg)  05/03/23 234 lb 12.8  oz (106.5 kg)   General: Alert, oriented, no distress.  Skin: normal turgor, no rashes, warm and dry HEENT: Normocephalic, atraumatic. Pupils equal round and reactive to light; sclera anicteric; extraocular muscles intact;  Nose without nasal septal hypertrophy Mouth/Parynx benign; Mallinpatti scale 3/4 Neck: No JVD, no carotid bruits; normal carotid upstroke Lungs: clear to ausculatation and percussion; no wheezing or rales Chest wall: without tenderness to palpitation Heart: PMI not displaced, RRR, s1 s2 normal, 1/6 systolic murmur, no diastolic murmur, no rubs, gallops, thrills, or heaves Abdomen: Moderate obesity;soft, nontender; no hepatosplenomehaly, BS+; abdominal aorta nontender and not dilated by palpation. Back: no CVA tenderness Pulses 2+ Musculoskeletal: full range of motion, normal strength, no joint deformities Extremities: no clubbing cyanosis or edema, Homan's sign negative  Neurologic: grossly nonfocal; Cranial nerves grossly wnl Psychologic: Normal mood and affect    EKG Interpretation Date/Time:  Thursday August 22 2023 16:07:26 EDT Ventricular Rate:  66 PR Interval:  164 QRS Duration:  78 QT Interval:  416 QTC Calculation: 436 R Axis:   48  Text Interpretation: Normal sinus rhythm Normal ECG When compared with ECG of 25-Aug-2013 06:59, No significant change was found Confirmed by Gregory Nixon ([id number]) on 08/22/2023 4:59:03 PM    January 31, 2021 ECG (independently read by me): NSR at 63; no ectopy, normal intervals  June 2019 ECG (independently read by me): Normal sinus rhythm at 76 bpm.  No ectopy.  Normal intervals.  No ST segment changes.  January 2018 ECG (independently  read by me): Normal sinus rhythm at 65 bpm.  No ectopy.  Normal intervals.  February 2016 ECG (independently read by me): Normal sinus rhythm at 62 bpm.  Normal ECG.  Normal intervals.  June 2015 ECG (independently read by me): Sinus bradycardia 49 beats per minute.  No ectopy.  Normal intervals.  Mild T-wave change in lead 1 and aVL.  ECG: Normal sinus rhythm at 62 beats per minute. QTc interval 403 ms. PR interval 164 ms.  LABS:    Latest Ref Rng & Units 06/27/2022    8:45 AM 01/31/2021    9:02 AM 05/05/2018   11:21 AM  BMP  Glucose 70 - 99 mg/dL 99  86  84   BUN 6 - 24 mg/dL 14  13  16    Creatinine 0.76 - 1.27 mg/dL 6.04  5.40  9.81   BUN/Creat Ratio 9 - 20 14  13  17    Sodium 134 - 144 mmol/L 140  138  140   Potassium 3.5 - 5.2 mmol/L 4.6  4.7  4.9   Chloride 96 - 106 mmol/L 103  102  102   CO2 20 - 29 mmol/L 23  22  22    Calcium 8.7 - 10.2 mg/dL 9.8  9.8  9.9       Latest Ref Rng & Units 06/27/2022    8:45 AM 01/31/2021    9:02 AM 08/22/2020    9:40 AM  Hepatic Function  Total Protein 6.0 - 8.5 g/dL 7.0  6.9  6.5   Albumin 3.8 - 4.9 g/dL 4.6  4.4  4.4   AST 0 - 40 IU/L 28  30  25    ALT 0 - 44 IU/L 35  42  32   Alk Phosphatase 44 - 121 IU/L 62  60  58   Total Bilirubin 0.0 - 1.2 mg/dL 0.7  0.5  0.5   Bilirubin, Direct 0.00 - 0.40 mg/dL   1.91       Latest  Ref Rng & Units 01/31/2021    9:02 AM 05/05/2018   11:21 AM 09/12/2015    5:49 AM  CBC  WBC 3.4 - 10.8 x10E3/uL 5.7  5.0  14.7   Hemoglobin 13.0 - 17.7 g/dL 81.1  91.4  78.2   Hematocrit 37.5 - 51.0 % 44.5  45.1  41.5   Platelets 150 - 450 x10E3/uL 230  210  225    Lab Results  Component Value Date   MCV 86 01/31/2021   MCV 88 05/05/2018   MCV 84.9 09/12/2015   Lab Results  Component Value Date   TSH 3.230 01/31/2021   Lab Results  Component Value Date   HGBA1C 4.9 07/28/2012   Lipid Panel     Component Value Date/Time   CHOL 192 06/27/2022 0845   TRIG 255 (H) 06/27/2022 0845   HDL 25 (L) 06/27/2022 0845    CHOLHDL 7.7 (H) 06/27/2022 0845   CHOLHDL 6.8 12/31/2014 1615   VLDL 38 12/31/2014 1615   LDLCALC 122 (H) 06/27/2022 0845    RADIOLOGY: No results found.  IMPRESSION:  1. CAD in native artery   2. S/P drug eluting coronary stent placement: LAD, LCX 2014   3. Essential hypertension   4. Mixed hyperlipidemia   5. Moderate obstructive sleep apnea   6. Obesity, Class II, BMI 35-39.9   7. Hypothyroidism, unspecified type     ASSESSMENT AND PLAN: Mr. Merril Mccusker is a 57 year-old gentleman with cardiac risk factors notable for hypertension, hyperlipidemia, and strong family history for premature coronary artery disease in both parents as well as in his sister. He developed fairly classic exertionally precipitated substernal chest pain associated with exertional dyspnea.  In October 2014 he was found to have severe three-vessel disease with total occlusion of his mid RCA with both antegrade collaterals to the PDA system but also retrograde collaterals supplying the distal RCA beyond the PDA takeoff. He underwent two-vessel coronary intervention to a 99% mid LAD stenosis and a 80% proximal circumflex stenosis with drug-eluting stents.  He had initially been on DAPT with aspirin/Brilinta and transiently on low-dose Brilinta but over the last several years has just been on aspirin alone.  I had not seen him in over 2-1/2 years.  He had been started on PCSK9 inhibition and when last seen by me he had been off therapy for some time awaiting to get renewal.  Most recently, he is on multidrug regimen for lipids with atorvastatin 80 mg, Repatha 140 mg every 2-week injection, Zetia 10 mg, and fenofibrate 160 mg.  Most recent lipid panel from August 01, 2020 still shows elevated triglycerides at 186 with LDL at 78 HDL 27 and total cholesterol 137.  I have recommended the addition of Vascepa 1 capsule twice a day to take in addition to his medical regimen and discussed the importance of reduction in sweets  and carbohydrates.  Not having any recurrent anginal symptoms.  His blood pressure today is stable and on repeat by me was 118/70 on losartan and metoprolol.  He is on levothyroxine for hypothyroidism with TSH on August 02, 2023 2.39.  He is followed by [facility] for OSA and sees Flint Melter, NP.  Strong family history for premature CAD in his premature history, in 3 months I am rechecking a comprehensive metabolic panel, fasting lipid panel and will also check LP(a).  Hopefully with Repatha if this was elevated he should have a 26 to 30% reduction from a baseline level.  I have  recommended he return in 1 year for follow-up evaluation and we will schedule him to see Dr. Herbie Baltimore or one of our other interventional cardiologists.   Gregory Bihari, MD, [facility] 08/22/2023 5:52 PM

## 2023-08-22 NOTE — Patient Instructions (Signed)
Medication Instructions:  Begin Vascepa 1 gram twice daily   *If you need a refill on your cardiac medications before your next appointment, please call your pharmacy*   Lab Work: Return for fasting labs in three months...............   CMET, LIPID LPa If you have labs (blood work) drawn today and your tests are completely normal, you will receive your results only by: MyChart Message (if you have MyChart) OR A paper copy in the mail If you have any lab test that is abnormal or we need to change your treatment, we will call you to review the results.   Testing/Procedures: None   Follow-Up: At [facility], you and your health needs are our priority.  As part of our continuing mission to provide you with exceptional heart care, we have created designated Provider Care Teams.  These Care Teams include your primary Cardiologist (physician) and Advanced Practice Providers (APPs -  Physician Assistants and Nurse Practitioners) who all work together to provide you with the care you need, when you need it.  We recommend signing up for the patient portal called "MyChart".  Sign up information is provided on this After Visit Summary.  MyChart is used to connect with patients for Virtual Visits (Telemedicine).  Patients are able to view lab/test results, encounter notes, upcoming appointments, etc.  Non-urgent messages can be sent to your provider as well.   To learn more about what you can do with MyChart, go to [email or web address].    Your next appointment:  6-9 months  You may call Ashley Mariner for your sleep care concerns at [phone]

## 2023-08-30 ENCOUNTER — Other Ambulatory Visit (HOSPITAL_COMMUNITY): Payer: Self-pay

## 2023-08-30 ENCOUNTER — Telehealth: Payer: Self-pay | Admitting: Pharmacy Technician

## 2023-08-30 NOTE — Telephone Encounter (Signed)
Pharmacy Patient Advocate Encounter  Received notification from [employer] that Prior Authorization for icosapent has been APPROVED from 08/30/23 to 08/29/2026   PA #/Case ID/Reference #: [id number]

## 2023-08-30 NOTE — Telephone Encounter (Signed)
Pharmacy Patient Advocate Encounter   Received notification from CoverMyMeds that prior authorization for icosapent is required/requested.   Insurance verification completed.   The patient is insured through [facility] .   Per test claim: PA required; PA submitted to above mentioned insurance via CoverMyMeds Key/confirmation #/EOC [employer] Status is pending

## 2023-09-09 DIAGNOSIS — G4733 Obstructive sleep apnea (adult) (pediatric): Secondary | ICD-10-CM | POA: Diagnosis not present

## 2023-09-10 ENCOUNTER — Ambulatory Visit: Payer: BC Managed Care – PPO | Admitting: Nurse Practitioner

## 2023-10-02 ENCOUNTER — Ambulatory Visit (INDEPENDENT_AMBULATORY_CARE_PROVIDER_SITE_OTHER): Payer: BC Managed Care – PPO | Admitting: Nurse Practitioner

## 2023-10-02 ENCOUNTER — Encounter: Payer: Self-pay | Admitting: Nurse Practitioner

## 2023-10-02 VITALS — BP 118/76 | HR 72 | Ht 67.0 in | Wt 238.2 lb

## 2023-10-02 DIAGNOSIS — E669 Obesity, unspecified: Secondary | ICD-10-CM

## 2023-10-02 DIAGNOSIS — F5102 Adjustment insomnia: Secondary | ICD-10-CM

## 2023-10-02 DIAGNOSIS — G4733 Obstructive sleep apnea (adult) (pediatric): Secondary | ICD-10-CM

## 2023-10-02 MED ORDER — ESZOPICLONE 1 MG PO TABS: 1.0000 mg | ORAL_TABLET | Freq: Every evening | ORAL | 0 refills | Status: DC | PRN

## 2023-10-02 NOTE — Assessment & Plan Note (Signed)
BMI 37. Healthy weight loss encouraged.  

## 2023-10-02 NOTE — Assessment & Plan Note (Signed)
Moderate OSA. Suboptimal compliance due to difficulties tolerating CPAP. Discussed risks of untreated moderate OSA and potential treatment options. He is willing to retry CPAP with alternative mask and pressure adjustments. Orders placed for AirFit F20 full face mask. Adjust settings to 5-12 cmH2O. Understands proper care/use of device. Will reassess at follow up. Safe driving practices reviewed.  Patient Instructions  Restart CPAP every night, minimum of 4-6 hours a night.  Change equipment as directed. Wash your tubing with warm soap and water daily, hang to dry. Wash humidifier portion weekly. Use bottled, distilled water and change daily Be aware of reduced alertness and do not drive or operate heavy machinery if experiencing this or drowsiness.  Exercise encouraged, as tolerated. Healthy weight management discussed.  Avoid or decrease alcohol consumption and medications that make you more sleepy, if possible. Notify if persistent daytime sleepiness occurs even with consistent use of PAP therapy.  Change to AirFit F20 full face mask Adjust pressures to 5-12 cmH2O  We discussed how untreated sleep apnea puts an individual at risk for cardiac arrhthymias, pulm HTN, DM, stroke and increases their risk for daytime accidents.   Start Lunesta 1 mg At bedtime as needed for sleep. Take immediately before bedtime. Put your CPAP on within 5 minutes of taking to avoid falling asleep without it as this could make your sleep apnea worse. Plan for 7-8 hours in the bed after taking. Do not drive or operate heavy machinery after taking. May cause some morning grogginess. Stop and notify if you have any changes in mood or odd sleep habits. If 1 mg doesn't help, we can go up to 2 mg but let me know before doing this  Follow up in 6 weeks with Katie Anneta Rounds,NP. If symptoms do not improve or worsen, please contact office for sooner follow up

## 2023-10-02 NOTE — Assessment & Plan Note (Addendum)
Difficulties with sleep latency due to CPAP. Will start him on low dose sleep aid with lunesta to help with adjustment period. PDMP reviewed. Side effect profile reviewed. Understands to monitor and notify of any changes in mood/sleep habits. Educated to apply CPAP after taking to avoid worsening sleep apnea. See above.

## 2023-10-02 NOTE — Progress Notes (Signed)
@Patient  ID: Gregory Nixon, male    DOB: 05-08-1966, 57 y.o.   MRN: [id number]  Chief Complaint  Patient presents with   Follow-up    OSA F/U visit    Referring provider: Blair Heys, MD  HPI: 57 year old male, never smoker followed for sleep apnea. Past medical history significant for HTN, unstable angina, CAD, gout, HLD, obesity.  TEST/EVENTS:  05/14/2023 HST: AHI 25.7/h, SpO2 low 73%  05/03/2023: Ov with Frantz Quattrone NP for sleep consult, referred by Dr. Manus Gunning. He has been told by his wife that he snores loudly and stops breathing. He has some fatigue at the end of the day but otherwise feels okay. He tends to toss and turn at night. Denies any drowsy driving, morning headaches, sleep parasomnias/paralysis. No history of narcolepsy or symptoms of cataplexy.  He goes to bed between 1030-11 pm. Falls asleep within 10-15 minutes. Wakes 2-3 times a night. Gets up around 5 am. He is a Control and instrumentation engineer. Never fallen asleep or felt drowsy while performing job duties. Weight is down 5 lb over the past 2 years. Never had a previous sleep study. No O2 use. He has a history of HTN, unstable angina, CAD s/p PCI. No history of MI, stroke, DM. He has a family history of sleep apnea.  He is a never smoker. Seldomly drinks alcohol. No excessive caffeine intake. Lives with his wife.  [place] 10  06/18/2023: OV with Kimberlly Norgard NP for follow up after undergoing home sleep study, which revealed moderate sleep apnea. He feels unchanged compared to his last visit. He has restless sleep at night. Doesn't necessarily feel very tired during the day except in the afternoon. He denies drowsy driving or morning headaches. Wants to discuss treatment options.   10/02/2023: Today - follow up Patient presents today for follow up. He had been using CPAP but he's stopped over the last month or so. He could not get used to the mask and he felt like it was blowing too hard. He also started having trouble falling asleep with it, when  he normally doesn't have any difficulties falling asleep. He didn't really notice much benefit and actually felt more tired when he did wear it because he was fighting with it. He does want to retry therapy with a full face mask. He denies any drowsy driving, morning headaches, sleep parasomnias/paralysis.   No Known Allergies  Immunization History  Administered Date(s) Administered   Influenza-Unspecified 08/11/2013    Past Medical History:  Diagnosis Date   Arthritis    hands   At risk for sleep apnea    STOP-BANG= 5     SENT TO PCP 09-29-2015   Coronary artery disease cardiologist-  dr [name]   08-24-2013 s/p  PCI and DES x2  to LAD and pCFX     Family history of premature CAD    History of gout    lower extremities-- per pt no issues since 2014   Hypertension    Right ureteral stone    S/P drug eluting coronary stent placement    08-24-2013  to LAD and pCFX    Tobacco History: Social History   Tobacco Use  Smoking Status Never  Smokeless Tobacco Never   Counseling given: Not Answered   Outpatient Medications Prior to Visit  Medication Sig Dispense Refill   aspirin EC 81 MG tablet Take 81 mg by mouth daily.     atorvastatin (LIPITOR) 80 MG tablet Take 1 tablet (80 mg total) by  mouth daily at 6 PM. (Patient taking differently: Take 80 mg by mouth every morning.) 30 tablet 5   Evolocumab 140 MG/ML SOAJ Inject 1 mL into the skin every 14 (fourteen) days. 2 mL 11   ezetimibe (ZETIA) 10 MG tablet TAKE 1 TABLET BY MOUTH EVERY DAY 90 tablet 3   fenofibrate 160 MG tablet Take 160 mg by mouth every morning.      ibuprofen (ADVIL,MOTRIN) 200 MG tablet Take 400 mg by mouth every 6 (six) hours as needed (for pain.).     icosapent Ethyl (VASCEPA) 1 g capsule Take 2 capsules (2 g total) by mouth 2 (two) times daily. 120 capsule 3   levothyroxine (SYNTHROID) 88 MCG tablet Take 88 mcg by mouth every morning.     losartan (COZAAR) 50 MG tablet Take 50 mg by mouth every morning.       metoprolol tartrate (LOPRESSOR) 25 MG tablet Take 1 tablet (25 mg total) by mouth 2 (two) times daily. 60 tablet 3   nitroGLYCERIN (NITROSTAT) 0.4 MG SL tablet Place 1 tablet (0.4 mg total) under the tongue every 5 (five) minutes as needed for chest pain. 25 tablet 3   No facility-administered medications prior to visit.     Review of Systems:   Constitutional: No weight gain/loss, night sweats, fevers, chills,or lassitude. +occasional daytime fatigue HEENT: No headaches, difficulty swallowing, tooth/dental problems, or sore throat. No sneezing, itching, ear ache, nasal congestion, or post nasal drip CV:  No chest pain, orthopnea, PND, swelling in lower extremities, anasarca, dizziness, palpitations, syncope Resp: +snoring, witnessed apneas. No shortness of breath with exertion or at rest. No excess mucus or change in color of mucus. No productive or non-productive. No hemoptysis. No wheezing.  No chest wall deformity GI:  No heartburn, indigestion GU: No dysuria, change in color of urine, urgency or frequency.   Skin: No rash, lesions, ulcerations MSK:  No joint pain or swelling.   Neuro: No dizziness or lightheadedness.  Psych: No depression or anxiety. Mood stable. +sleep disturbance    Physical Exam:  BP 118/76 (BP Location: Right Arm, Cuff Size: Large)   Pulse 72   Ht 5\' 7"  (1.702 m)   Wt 238 lb 3.2 oz (108 kg)   SpO2 96%   BMI 37.31 kg/m   GEN: Pleasant, interactive, well-appearing; obese; in no acute distress. HEENT:  Normocephalic and atraumatic. PERRLA. Sclera white. Nasal turbinates pink, moist and patent bilaterally. No rhinorrhea present. Oropharynx pink and moist, without exudate or edema. No lesions, ulcerations, or postnasal drip. Mallampati III NECK:  Supple w/ fair ROM. No JVD present. Normal carotid impulses w/o bruits. Thyroid symmetrical with no goiter or nodules palpated. No lymphadenopathy.   CV: RRR, no m/r/g, no peripheral edema. Pulses intact, +2  bilaterally. No cyanosis, pallor or clubbing. PULMONARY:  Unlabored, regular breathing. Clear bilaterally A&P w/o wheezes/rales/rhonchi. No accessory muscle use.  GI: BS present and normoactive. Soft, non-tender to palpation. No organomegaly or masses detected.  MSK: No erythema, warmth or tenderness. Cap refil <2 sec all extrem. No deformities or joint swelling noted.  Neuro: A/Ox3. No focal deficits noted.   Skin: Warm, no lesions or rashe Psych: Normal affect and behavior. Judgement and thought content appropriate.     Lab Results:  CBC    Component Value Date/Time   WBC 5.7 01/31/2021 0902   WBC 14.7 (H) 09/12/2015 0549   RBC 5.15 01/31/2021 0902   RBC 4.89 09/12/2015 0549   HGB 15.4 01/31/2021 0902  HCT 44.5 01/31/2021 0902   PLT 230 01/31/2021 0902   MCV 86 01/31/2021 0902   MCH 29.9 01/31/2021 0902   MCH 30.9 09/12/2015 0549   MCHC 34.6 01/31/2021 0902   MCHC 36.4 (H) 09/12/2015 0549   RDW 12.3 01/31/2021 0902   LYMPHSABS 1.0 09/12/2015 0549   MONOABS 1.4 (H) 09/12/2015 0549   EOSABS 0.0 09/12/2015 0549   BASOSABS 0.0 09/12/2015 0549    BMET    Component Value Date/Time   NA 140 06/27/2022 0845   K 4.6 06/27/2022 0845   CL 103 06/27/2022 0845   CO2 23 06/27/2022 0845   GLUCOSE 99 06/27/2022 0845   GLUCOSE 121 (H) 09/12/2015 0549   BUN 14 06/27/2022 0845   CREATININE 1.00 06/27/2022 0845   CREATININE 0.98 12/31/2014 1615   CALCIUM 9.8 06/27/2022 0845   GFRNONAA 94 05/05/2018 1121   GFRAA 109 05/05/2018 1121    BNP No results found for: "BNP"   Imaging:  No results found.  Administration History     None           No data to display          No results found for: "NITRICOXIDE"      Assessment & Plan:   Moderate obstructive sleep apnea Moderate OSA. Suboptimal compliance due to difficulties tolerating CPAP. Discussed risks of untreated moderate OSA and potential treatment options. He is willing to retry CPAP with alternative mask  and pressure adjustments. Orders placed for AirFit F20 full face mask. Adjust settings to 5-12 cmH2O. Understands proper care/use of device. Will reassess at follow up. Safe driving practices reviewed.  Patient Instructions  Restart CPAP every night, minimum of 4-6 hours a night.  Change equipment as directed. Wash your tubing with warm soap and water daily, hang to dry. Wash humidifier portion weekly. Use bottled, distilled water and change daily Be aware of reduced alertness and do not drive or operate heavy machinery if experiencing this or drowsiness.  Exercise encouraged, as tolerated. Healthy weight management discussed.  Avoid or decrease alcohol consumption and medications that make you more sleepy, if possible. Notify if persistent daytime sleepiness occurs even with consistent use of PAP therapy.  Change to AirFit F20 full face mask Adjust pressures to 5-12 cmH2O  We discussed how untreated sleep apnea puts an individual at risk for cardiac arrhthymias, pulm HTN, DM, stroke and increases their risk for daytime accidents.   Start Lunesta 1 mg At bedtime as needed for sleep. Take immediately before bedtime. Put your CPAP on within 5 minutes of taking to avoid falling asleep without it as this could make your sleep apnea worse. Plan for 7-8 hours in the bed after taking. Do not drive or operate heavy machinery after taking. May cause some morning grogginess. Stop and notify if you have any changes in mood or odd sleep habits. If 1 mg doesn't help, we can go up to 2 mg but let me know before doing this  Follow up in 6 weeks with Katie Nikala Walsworth,NP. If symptoms do not improve or worsen, please contact office for sooner follow up    Adjustment insomnia Difficulties with sleep latency due to CPAP. Will start him on low dose sleep aid with lunesta to help with adjustment period. PDMP reviewed. Side effect profile reviewed. Understands to monitor and notify of any changes in mood/sleep habits.  Educated to apply CPAP after taking to avoid worsening sleep apnea. See above.   Obesity (BMI 30-39.9) BMI 37. Healthy  weight loss encouraged     I spent 35 minutes of dedicated to the care of this patient on the date of this encounter to include pre-visit review of records, face-to-face time with the patient discussing conditions above, post visit ordering of testing, clinical documentation with the electronic health record, making appropriate referrals as documented, and communicating necessary findings to members of the patients care team.  Noemi Chapel, NP 10/02/2023  Pt aware and understands NP's role.

## 2023-10-02 NOTE — Patient Instructions (Addendum)
Restart CPAP every night, minimum of 4-6 hours a night.  Change equipment as directed. Wash your tubing with warm soap and water daily, hang to dry. Wash humidifier portion weekly. Use bottled, distilled water and change daily Be aware of reduced alertness and do not drive or operate heavy machinery if experiencing this or drowsiness.  Exercise encouraged, as tolerated. Healthy weight management discussed.  Avoid or decrease alcohol consumption and medications that make you more sleepy, if possible. Notify if persistent daytime sleepiness occurs even with consistent use of PAP therapy.  Change to AirFit F20 full face mask Adjust pressures to 5-12 cmH2O  We discussed how untreated sleep apnea puts an individual at risk for cardiac arrhthymias, pulm HTN, DM, stroke and increases their risk for daytime accidents.   Start Lunesta 1 mg At bedtime as needed for sleep. Take immediately before bedtime. Put your CPAP on within 5 minutes of taking to avoid falling asleep without it as this could make your sleep apnea worse. Plan for 7-8 hours in the bed after taking. Do not drive or operate heavy machinery after taking. May cause some morning grogginess. Stop and notify if you have any changes in mood or odd sleep habits. If 1 mg doesn't help, we can go up to 2 mg but let me know before doing this  Follow up in 6 weeks with Katie Angelena Sand,NP. If symptoms do not improve or worsen, please contact office for sooner follow up

## 2023-10-04 DIAGNOSIS — Z23 Encounter for immunization: Secondary | ICD-10-CM | POA: Diagnosis not present

## 2023-10-09 DIAGNOSIS — G4733 Obstructive sleep apnea (adult) (pediatric): Secondary | ICD-10-CM | POA: Diagnosis not present

## 2023-10-14 ENCOUNTER — Other Ambulatory Visit: Payer: Self-pay | Admitting: Cardiovascular Disease

## 2023-10-14 DIAGNOSIS — I2510 Atherosclerotic heart disease of native coronary artery without angina pectoris: Secondary | ICD-10-CM

## 2023-10-14 DIAGNOSIS — E7800 Pure hypercholesterolemia, unspecified: Secondary | ICD-10-CM

## 2023-10-14 NOTE — Telephone Encounter (Signed)
Pt is requesting a refill on atorvastatin, fenofibrate and losartan. These medication have not been refilled since 2014. Would Dr. Tresa Endo like to refill these medications? Please address

## 2023-10-14 NOTE — Telephone Encounter (Signed)
*  STAT* If patient is at the pharmacy, call can be transferred to refill team.   1. Which medications need to be refilled? (please list name of each medication and dose if known)   atorvastatin (LIPITOR) 80 MG tablet  Evolocumab 140 MG/ML SOAJ  fenofibrate 160 MG tablet  losartan (COZAAR) 50 MG tablet   2. Would you like to learn more about the convenience, safety, & potential cost savings by using the [facility]?   3. Are you open to using the [facility] (Type [facility]. ).  4. Which pharmacy/location (including street and city if local pharmacy) is medication to be sent to?  [id number] [address], [place] [phone]   5. Do they need a 30 day or 90 day supply?   90 day  Patient stated he completely out of these medication.

## 2023-10-15 ENCOUNTER — Other Ambulatory Visit (HOSPITAL_COMMUNITY): Payer: Self-pay

## 2023-10-15 MED ORDER — FENOFIBRATE 160 MG PO TABS
160.0000 mg | ORAL_TABLET | Freq: Every morning | ORAL | 5 refills | Status: AC
  Filled 2023-10-15: qty 30, 30d supply, fill #0

## 2023-10-15 MED ORDER — LOSARTAN POTASSIUM 50 MG PO TABS
50.0000 mg | ORAL_TABLET | Freq: Every morning | ORAL | 5 refills | Status: DC
  Filled 2023-10-15: qty 30, 30d supply, fill #0

## 2023-10-15 MED ORDER — ATORVASTATIN CALCIUM 80 MG PO TABS
80.0000 mg | ORAL_TABLET | Freq: Every day | ORAL | 5 refills | Status: AC
  Filled 2023-10-15: qty 30, 30d supply, fill #0

## 2023-10-15 MED ORDER — EVOLOCUMAB 140 MG/ML ~~LOC~~ SOAJ
1.0000 mL | SUBCUTANEOUS | 11 refills | Status: DC
  Filled 2023-10-15: qty 2, 28d supply, fill #0

## 2023-10-16 ENCOUNTER — Other Ambulatory Visit (HOSPITAL_COMMUNITY): Payer: Self-pay

## 2023-10-17 ENCOUNTER — Other Ambulatory Visit (HOSPITAL_COMMUNITY): Payer: Self-pay

## 2023-10-17 DIAGNOSIS — G4733 Obstructive sleep apnea (adult) (pediatric): Secondary | ICD-10-CM | POA: Diagnosis not present

## 2023-10-26 ENCOUNTER — Other Ambulatory Visit: Payer: Self-pay | Admitting: Cardiovascular Disease

## 2023-11-09 DIAGNOSIS — G4733 Obstructive sleep apnea (adult) (pediatric): Secondary | ICD-10-CM | POA: Diagnosis not present

## 2023-11-13 ENCOUNTER — Ambulatory Visit: Payer: BC Managed Care – PPO | Admitting: Nurse Practitioner

## 2023-11-17 DIAGNOSIS — G4733 Obstructive sleep apnea (adult) (pediatric): Secondary | ICD-10-CM | POA: Diagnosis not present

## 2023-11-25 IMAGING — CT CT CHEST W/O CM
1 series · 15 of 34 positions shown, 19 images · non-contrast
Comparison: None.

CLINICAL DATA: Abnormal chest x-ray



[Series 2: chest w/o 2mm st · axial · non-contrast · 0.85mm/px · z∈[-317,-73]mm · 15 of 144 slices shown, 19 images]
[im 11/144  mediastinal]
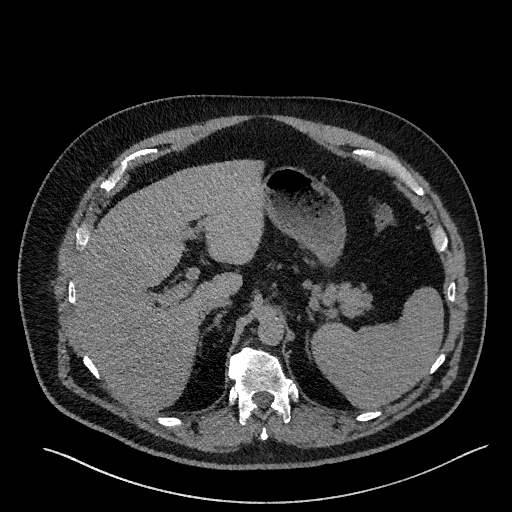
[im 11/144  lung]
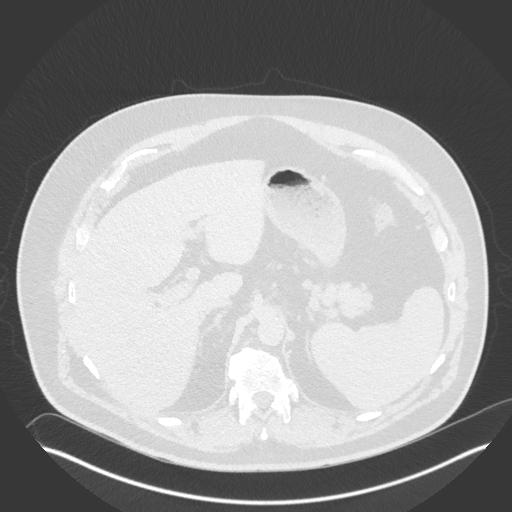
[im 22/144  lung]
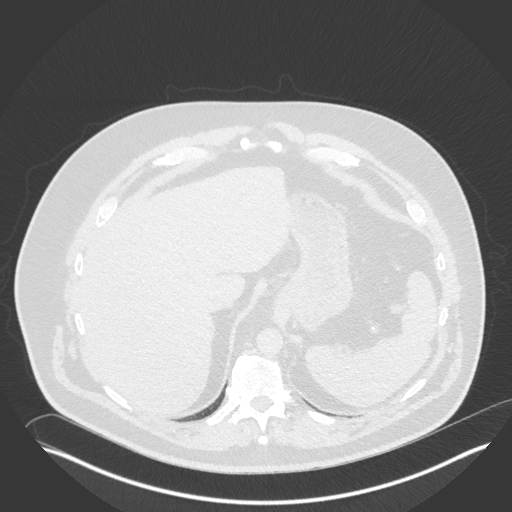
[im 29/144  lung]
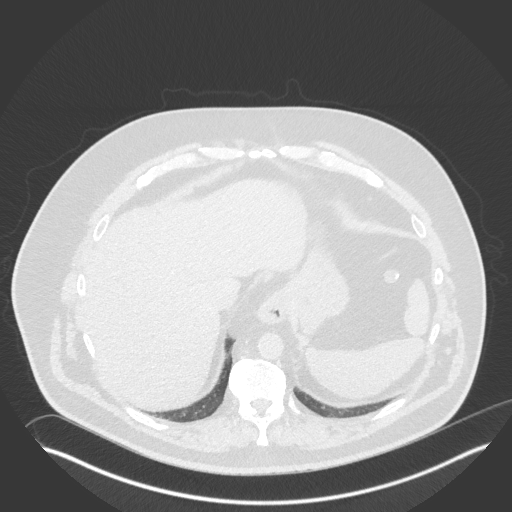
[im 38/144  lung]
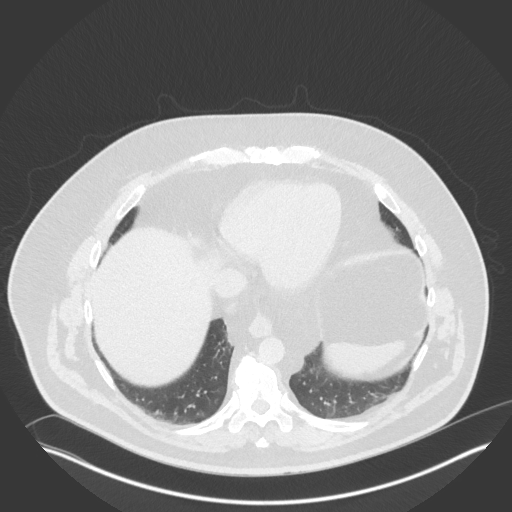
[im 48/144  mediastinal]
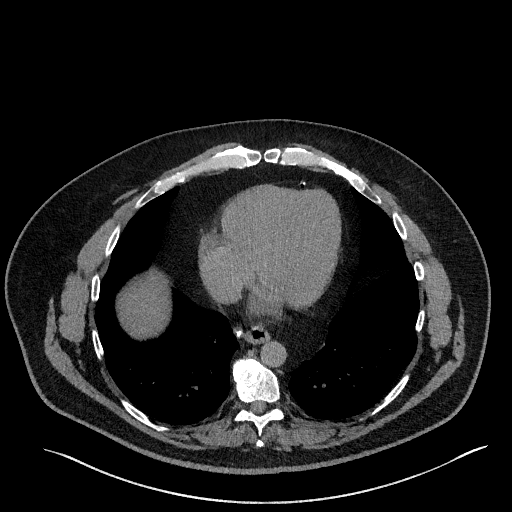
[im 48/144  lung]
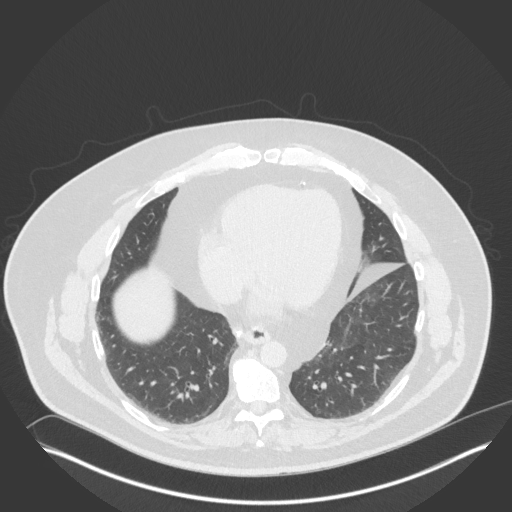
[im 58/144  lung]
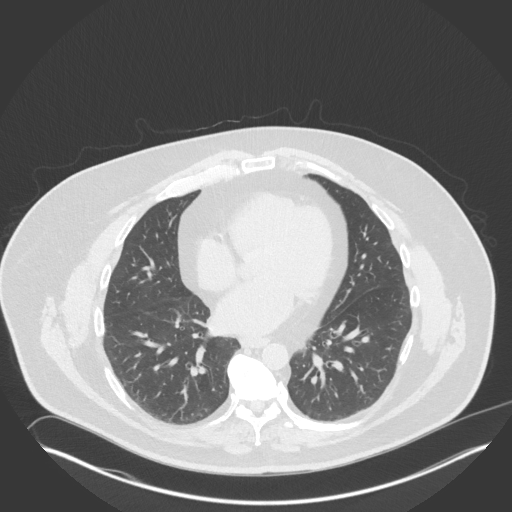
[im 64/144  lung]
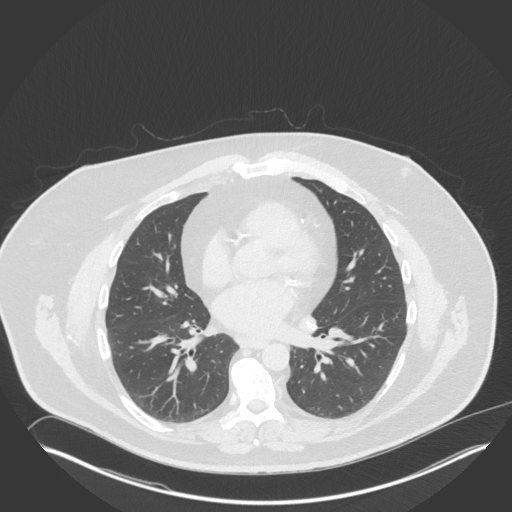
[im 75/144  lung]
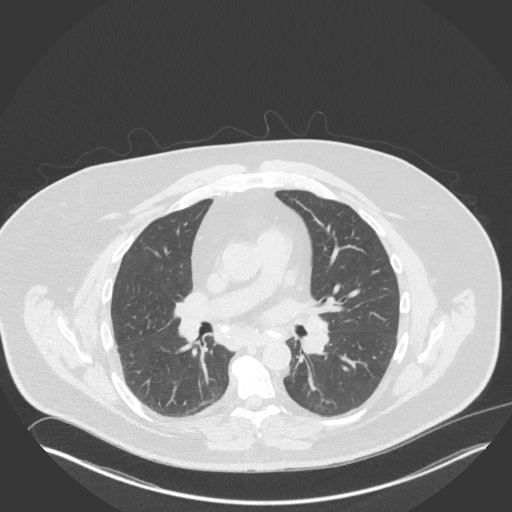
[im 80/144  mediastinal]
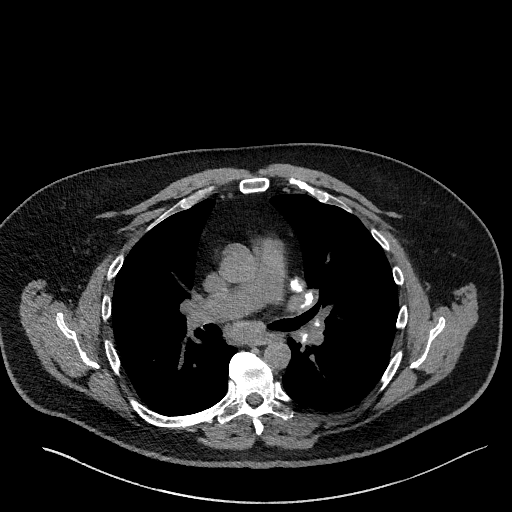
[im 80/144  lung]
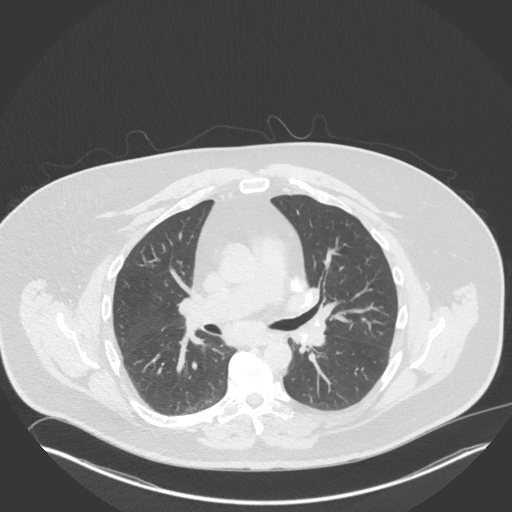
[im 86/144  lung]
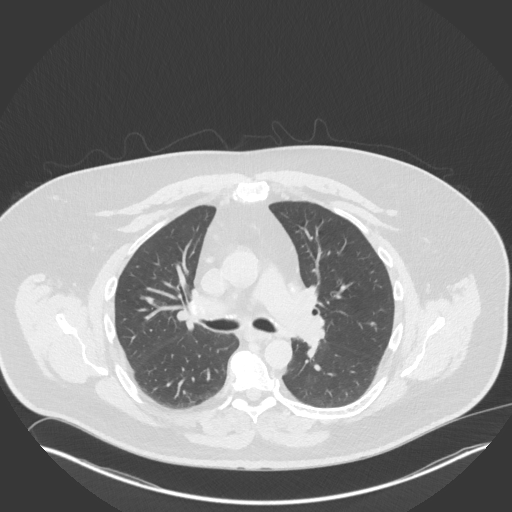
[im 96/144  lung]
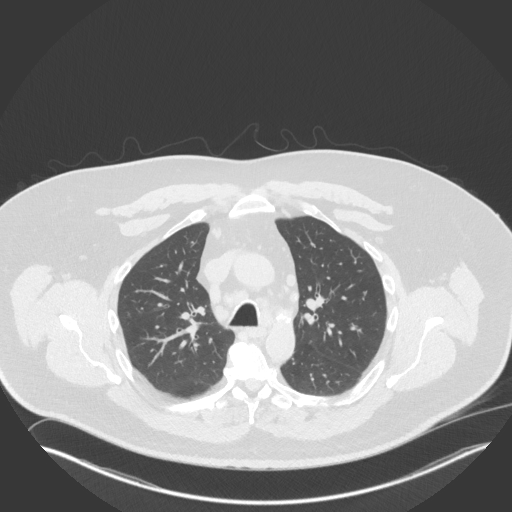
[im 106/144  lung]
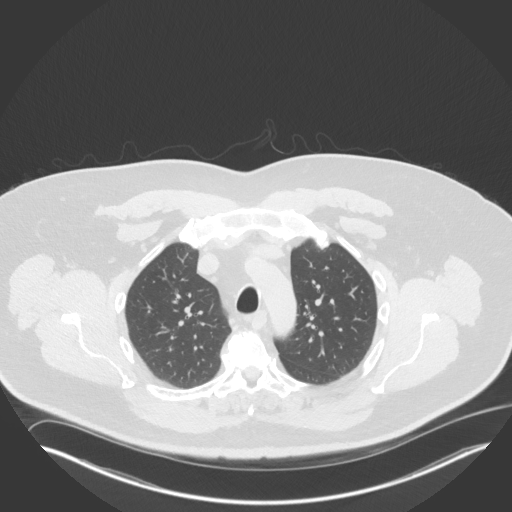
[im 115/144  mediastinal]
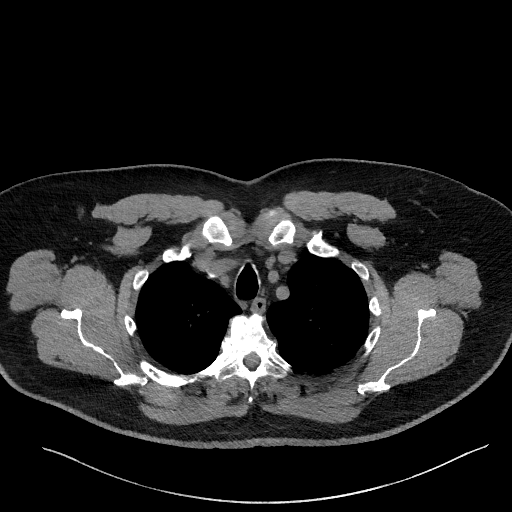
[im 115/144  lung]
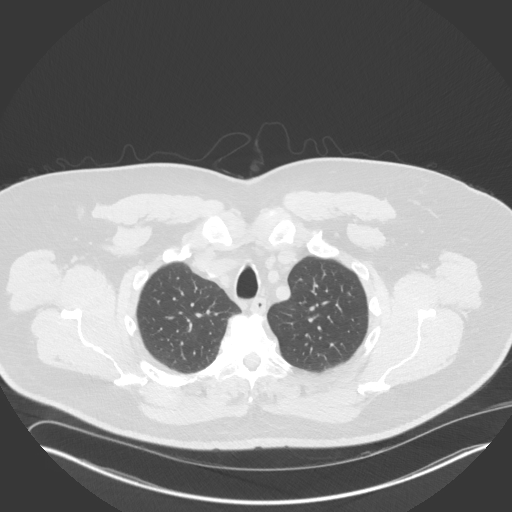
[im 122/144  lung]
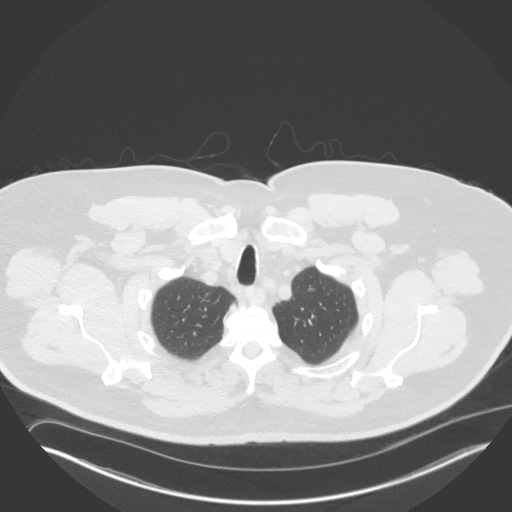
[im 133/144  lung]
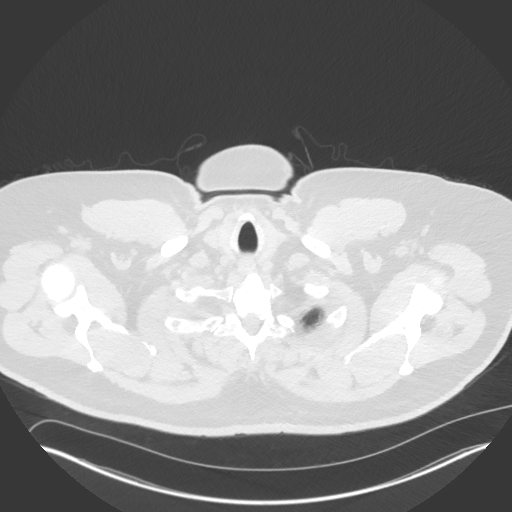

[15 of 34 positions shown; findings below may reference images not displayed]

FINDINGS: Cardiovascular: Extensive multi-vessel coronary artery
calcification. Global cardiac size within normal limits. No
pericardial effusion. Central pulmonary arteries are of normal
caliber. Thoracic aorta is unremarkable.

Mediastinum/Nodes: Right paratracheal stripe widening likely
corresponds to exuberant osteophyte formation at the right fourth
costovertebral junction. Numerous partially calcified mediastinal
and bilateral hilar lymph nodes are identified in keeping with old
granulomatous disease or treated disease. There is no pathologic
thoracic adenopathy identified. Thyroid unremarkable. Esophagus
unremarkable.

Lungs/Pleura: Lungs are clear. No pneumothorax or pleural effusion.
Central airways are widely patent.

Upper Abdomen: No acute abnormality.

Musculoskeletal: Degenerative changes are seen within the thoracic
spine. No acute bone abnormality. No lytic or blastic bone lesion.
IMPRESSION: Abnormality noted on chest radiograph of 12/04/2021 likely
corresponds to exuberant osteophyte formation at the right fourth
costovertebral junction.

Extensive coronary artery calcification.

Numerous calcified mediastinal and hilar lymph nodes in keeping with
old granulomatous disease or treated disease. No pathologic thoracic
adenopathy.

## 2023-12-10 DIAGNOSIS — G4733 Obstructive sleep apnea (adult) (pediatric): Secondary | ICD-10-CM | POA: Diagnosis not present

## 2023-12-11 ENCOUNTER — Telehealth: Payer: Self-pay | Admitting: Cardiovascular Disease

## 2023-12-11 DIAGNOSIS — I2510 Atherosclerotic heart disease of native coronary artery without angina pectoris: Secondary | ICD-10-CM

## 2023-12-11 DIAGNOSIS — E7800 Pure hypercholesterolemia, unspecified: Secondary | ICD-10-CM

## 2023-12-11 NOTE — Telephone Encounter (Signed)
*  STAT* If patient is at the pharmacy, call can be transferred to refill team.   1. Which medications need to be refilled? (please list name of each medication and dose if known)   Evolocumab 140 MG/ML SOAJ     4. Which pharmacy/location (including street and city if local pharmacy) is medication to be sent to?   [id number] [address], [place] [address] Phone: [phone]  Fax: [phone]       5. Do they need a 30 day or 90 day supply? 90

## 2023-12-12 ENCOUNTER — Other Ambulatory Visit (HOSPITAL_COMMUNITY): Payer: Self-pay

## 2023-12-12 ENCOUNTER — Telehealth: Payer: Self-pay | Admitting: Pharmacy Technician

## 2023-12-12 MED ORDER — EVOLOCUMAB 140 MG/ML ~~LOC~~ SOAJ: 1.0000 mL | SUBCUTANEOUS | 11 refills | Status: DC

## 2023-12-12 NOTE — Telephone Encounter (Signed)
Pharmacy Patient Advocate Encounter   Received notification from CoverMyMeds that prior authorization for repatha is required/requested.   Insurance verification completed.   The patient is insured through  [employer]  .   Per test claim: PA required; PA submitted to above mentioned insurance via CoverMyMeds Key/confirmation #/EOC [id number] Status is pending

## 2023-12-12 NOTE — Telephone Encounter (Signed)
Pharmacy Patient Advocate Encounter  Received notification from  [employer]  that Prior Authorization for repatha has been APPROVED from 12/12/23 to 12/10/24   PA #/Case ID/Reference #: [id number]

## 2023-12-18 DIAGNOSIS — G4733 Obstructive sleep apnea (adult) (pediatric): Secondary | ICD-10-CM | POA: Diagnosis not present

## 2023-12-24 ENCOUNTER — Ambulatory Visit: Payer: BC Managed Care – PPO

## 2023-12-24 ENCOUNTER — Ambulatory Visit: Payer: BC Managed Care – PPO | Admitting: Nurse Practitioner

## 2023-12-24 ENCOUNTER — Encounter: Payer: Self-pay | Admitting: Nurse Practitioner

## 2023-12-24 VITALS — BP 128/74 | HR 66 | Ht 67.0 in | Wt 238.0 lb

## 2023-12-24 DIAGNOSIS — R058 Other specified cough: Secondary | ICD-10-CM

## 2023-12-24 DIAGNOSIS — R053 Chronic cough: Secondary | ICD-10-CM

## 2023-12-24 DIAGNOSIS — F5102 Adjustment insomnia: Secondary | ICD-10-CM

## 2023-12-24 DIAGNOSIS — J310 Chronic rhinitis: Secondary | ICD-10-CM | POA: Diagnosis not present

## 2023-12-24 DIAGNOSIS — G4733 Obstructive sleep apnea (adult) (pediatric): Secondary | ICD-10-CM

## 2023-12-24 MED ORDER — FLUTICASONE PROPIONATE 50 MCG/ACT NA SUSP: 2.0000 | Freq: Every day | NASAL | 2 refills | Status: DC

## 2023-12-24 NOTE — Patient Instructions (Signed)
 Use CPAP every night, minimum of 4-6 hours a night.  Change equipment as directed. Wash your tubing with warm soap and water daily, hang to dry. Wash humidifier portion weekly. Use bottled, distilled water and change daily Be aware of reduced alertness and do not drive or operate heavy machinery if experiencing this or drowsiness.  Exercise encouraged, as tolerated. Healthy weight management discussed.  Avoid or decrease alcohol consumption and medications that make you more sleepy, if possible. Notify if persistent daytime sleepiness occurs even with consistent use of PAP therapy.  We discussed how untreated sleep apnea puts an individual at risk for cardiac arrhthymias, pulm HTN, DM, stroke and increases their risk for daytime accidents. We also briefly reviewed treatment options including weight loss, side sleeping position, oral appliance, CPAP therapy or referral to ENT for possible surgical options  We will schedule you for an in lab titration study to see if we can get you more comfortable on CPAP therapy  Continue lunesta 1 mg At bedtime as needed for sleep. Take immediately before bed. Ensure you have 7-8 hours in the bed after taking. Monitor for any mood changes or changes in sleep habits. Stop and notify immediately if these occur. Do not drive or operate heavy machinery after taking. Do not take with alcohol or other sedating medications. Ensure you apply your CPAP within 5-10 minutes of taking to avoid falling asleep without it. May cause some morning grogginess or vivid dreams.   Start flonase nasal spray 2 sprays each nostril before bed Try over the counter chlorpheniramine 4 mg At bedtime for cough/drainage.  We will schedule you for lung function testing  Chest x ray today   Follow up in 8 weeks after PFT with Dr. Wynona Neat (new pt) or Katie Da Authement,NP. If symptoms do not improve or worsen, please contact office for sooner follow up or seek emergency care.

## 2023-12-24 NOTE — Assessment & Plan Note (Signed)
 Infrequency use of lunesta. Will reassess after CPAP titration

## 2023-12-24 NOTE — Assessment & Plan Note (Addendum)
 Moderate OSA. Suboptimal compliance with CPAP. Difficulties tolerating therapy despite mask change and pressure adjustment. He had moderate leaks and high residual AHI, which I suspect  a component of this is due to leaks. Difficulties tolerating higher pressures. Reviewed risks of untreated moderate OSA and treatment options including CPAP or hypoglossal nerve stimulator implantation. He is will to retry CPAP. Will set him up for CPAP titration study to determine appropriate settings/mask fit or see if he needs to transition to BiLevel support. Healthy weight loss encouraged. Aware of safe driving practices.  Patient Instructions  Use CPAP every night, minimum of 4-6 hours a night.  Change equipment as directed. Wash your tubing with warm soap and water daily, hang to dry. Wash humidifier portion weekly. Use bottled, distilled water and change daily Be aware of reduced alertness and do not drive or operate heavy machinery if experiencing this or drowsiness.  Exercise encouraged, as tolerated. Healthy weight management discussed.  Avoid or decrease alcohol consumption and medications that make you more sleepy, if possible. Notify if persistent daytime sleepiness occurs even with consistent use of PAP therapy.  We discussed how untreated sleep apnea puts an individual at risk for cardiac arrhthymias, pulm HTN, DM, stroke and increases their risk for daytime accidents. We also briefly reviewed treatment options including weight loss, side sleeping position, oral appliance, CPAP therapy or referral to ENT for possible surgical options  We will schedule you for an in lab titration study to see if we can get you more comfortable on CPAP therapy  Continue lunesta 1 mg At bedtime as needed for sleep. Take immediately before bed. Ensure you have 7-8 hours in the bed after taking. Monitor for any mood changes or changes in sleep habits. Stop and notify immediately if these occur. Do not drive or operate heavy  machinery after taking. Do not take with alcohol or other sedating medications. Ensure you apply your CPAP within 5-10 minutes of taking to avoid falling asleep without it. May cause some morning grogginess or vivid dreams.   Start flonase nasal spray 2 sprays each nostril before bed Try over the counter chlorpheniramine 4 mg At bedtime for cough/drainage.  We will schedule you for lung function testing  Chest x ray today   Follow up in 8 weeks after PFT with Dr. Wynona Neat (new pt) or Katie Idell Hissong,NP. If symptoms do not improve or worsen, please contact office for sooner follow up or seek emergency care.

## 2023-12-24 NOTE — Assessment & Plan Note (Signed)
 Trial intranasal steroid. Consider additional allergy testing if symptoms persist

## 2023-12-24 NOTE — Assessment & Plan Note (Addendum)
 Possibly irritable larynx with upper airway cough based on timeline/constellation of symptoms. Associated sinus symptoms. No other respiratory symptoms reported. Will obtain CXR today and set him up for PFTs. Trial addition of intranasal steroid. Reassess response at follow up. If PFT normal and no significant change with flonase, can add on PPI or H2 blocker to target silent GERD symptoms which would contribute to cough and possible ENT referral. He is a never smoker and lower suspicion for asthma given onset and history.

## 2023-12-24 NOTE — Progress Notes (Signed)
 @Patient  ID: Gregory Nixon, male    DOB: 13-Jul-1966, 58 y.o.   MRN: [id number]  Chief Complaint  Patient presents with   Follow-up    Referring provider: Irven Coe, MD  HPI: 58 year old male, never smoker followed for sleep apnea. Past medical history significant for HTN, unstable angina, CAD, gout, HLD, obesity.  TEST/EVENTS:  05/14/2023 HST: AHI 25.7/h, SpO2 low 73%  05/03/2023: Ov with Gregory Messman NP for sleep consult, referred by Dr. Manus Gunning. He has been told by his wife that he snores loudly and stops breathing. He has some fatigue at the end of the day but otherwise feels okay. He tends to toss and turn at night. Denies any drowsy driving, morning headaches, sleep parasomnias/paralysis. No history of narcolepsy or symptoms of cataplexy.  He goes to bed between 1030-11 pm. Falls asleep within 10-15 minutes. Wakes 2-3 times a night. Gets up around 5 am. He is a Control and instrumentation engineer. Never fallen asleep or felt drowsy while performing job duties. Weight is down 5 lb over the past 2 years. Never had a previous sleep study. No O2 use. He has a history of HTN, unstable angina, CAD s/p PCI. No history of MI, stroke, DM. He has a family history of sleep apnea.  He is a never smoker. Seldomly drinks alcohol. No excessive caffeine intake. Lives with his wife.  [place] 10  06/18/2023: OV with Gregory Rasor NP for follow up after undergoing home sleep study, which revealed moderate sleep apnea. He feels unchanged compared to his last visit. He has restless sleep at night. Doesn't necessarily feel very tired during the day except in the afternoon. He denies drowsy driving or morning headaches. Wants to discuss treatment options.   10/02/2023: OV with Gregory Wenberg NP for follow up. He had been using CPAP but he's stopped over the last month or so. He could not get used to the mask and he felt like it was blowing too hard. He also started having trouble falling asleep with it, when he normally doesn't have any difficulties  falling asleep. He didn't really notice much benefit and actually felt more tired when he did wear it because he was fighting with it. He does want to retry therapy with a full face mask. He denies any drowsy driving, morning headaches, sleep parasomnias/paralysis.   12/24/2023: Today - follow up Patient presents today for follow up. He's still not been using CPAP consistently. He felt like he wasn't tolerating the pressures last time so we adjusted them down to 5-15 cmH2O. He is still having high leaks and residual AHI. He is having difficulties adjusting to therapy, even with lunesta. He did have a few nights he used both and did feel like he slept a little better. He denies any drowsy driving, sleep parasomnias/paralysis.  He also wanted to discuss issues with a cough that he's had for 1-2 years now. It started after he had a colonoscopy. He was told he wasn't intubated but he woke up with throat irritation and a cough. He had another colonoscopy about 6 months ago and did have to be intubated. Had similar symptoms after. Cough got a little worse but then went back to normal. It's more troublesome in the AM. Produces yellow phlegm and then throughout the day, usually dry. Denies any issues with his breathing, wheezing, fevers, chills, hemoptysis. He does feel like some of it comes from his sinuses. Not currently using any sprays or allergy medications. He denies any overt reflux symptoms. He denies  orthopnea, PND, leg swelling.   11/23/2023-12/22/2023 CPAP 5-15 cmH2O 9/30 days; 7% >4 hr; average use 2 hr 39 min Pressure 95th 11.4 Leaks 95th 41.5 AHI 20.9  No Known Allergies  Immunization History  Administered Date(s) Administered   Influenza-Unspecified 08/11/2013    Past Medical History:  Diagnosis Date   Arthritis    hands   At risk for sleep apnea    STOP-BANG= 5     SENT TO PCP 09-29-2015   Coronary artery disease cardiologist-  dr [name]   08-24-2013 s/p  PCI and DES x2  to LAD  and pCFX     Family history of premature CAD    History of gout    lower extremities-- per pt no issues since 2014   Hypertension    Right ureteral stone    S/P drug eluting coronary stent placement    08-24-2013  to LAD and pCFX    Tobacco History: Social History   Tobacco Use  Smoking Status Never  Smokeless Tobacco Never   Counseling given: Not Answered   Outpatient Medications Prior to Visit  Medication Sig Dispense Refill   aspirin EC 81 MG tablet Take 81 mg by mouth daily.     atorvastatin (LIPITOR) 80 MG tablet Take 1 tablet (80 mg total) by mouth daily at 6 PM. 30 tablet 5   eszopiclone (LUNESTA) 1 MG TABS tablet Take 1 tablet (1 mg total) by mouth at bedtime as needed for sleep. Take immediately before bedtime 30 tablet 0   Evolocumab 140 MG/ML SOAJ Inject 140 mg into the skin every 14 (fourteen) days. 2 mL 11   ezetimibe (ZETIA) 10 MG tablet TAKE 1 TABLET BY MOUTH EVERY DAY 90 tablet 3   fenofibrate 160 MG tablet Take 1 tablet (160 mg total) by mouth every morning. 30 tablet 5   ibuprofen (ADVIL,MOTRIN) 200 MG tablet Take 400 mg by mouth every 6 (six) hours as needed (for pain.).     icosapent Ethyl (VASCEPA) 1 g capsule TAKE 2 CAPSULES BY MOUTH 2 TIMES DAILY. 360 capsule 3   levothyroxine (SYNTHROID) 88 MCG tablet Take 88 mcg by mouth every morning.     losartan (COZAAR) 50 MG tablet Take 1 tablet (50 mg total) by mouth every morning. 30 tablet 5   metoprolol tartrate (LOPRESSOR) 25 MG tablet Take 1 tablet (25 mg total) by mouth 2 (two) times daily. 60 tablet 3   nitroGLYCERIN (NITROSTAT) 0.4 MG SL tablet Place 1 tablet (0.4 mg total) under the tongue every 5 (five) minutes as needed for chest pain. 25 tablet 3   No facility-administered medications prior to visit.     Review of Systems:   Constitutional: No weight gain/loss, night sweats, fevers, chills,or lassitude. +occasional daytime fatigue HEENT: No headaches, difficulty swallowing, tooth/dental problems, or  sore throat. No sneezing, itching, ear ache +nasal congestion, post nasal drip CV:  No chest pain, orthopnea, PND, swelling in lower extremities, anasarca, dizziness, palpitations, syncope Resp: +snoring, witnessed apneas; chronic cough. No shortness of breath with exertion or at rest. No excess mucus or change in color of mucus. No hemoptysis. No wheezing.  No chest wall deformity GI:  No heartburn, indigestion GU: No dysuria, change in color of urine, urgency or frequency.   Skin: No rash, lesions, ulcerations MSK:  No joint pain or swelling.   Neuro: No dizziness or lightheadedness.  Psych: No depression or anxiety. Mood stable. +sleep disturbance    Physical Exam:  BP 128/74 (BP Location:  Right Arm, Patient Position: Sitting, Cuff Size: Large)   Pulse 66   Ht 5\' 7"  (1.702 m)   Wt 238 lb (108 kg)   SpO2 99%   BMI 37.28 kg/m   GEN: Pleasant, interactive, well-appearing; obese; in no acute distress. HEENT:  Normocephalic and atraumatic. PERRLA. Sclera white. Nasal turbinates pink, moist and patent bilaterally. Clear rhinorrhea present. Oropharynx pink and moist, without exudate or edema. No lesions, ulcerations, or postnasal drip. Mallampati III NECK:  Supple w/ fair ROM. No JVD present. Normal carotid impulses w/o bruits. Thyroid symmetrical with no goiter or nodules palpated. No lymphadenopathy.   CV: RRR, no m/r/g, no peripheral edema. Pulses intact, +2 bilaterally. No cyanosis, pallor or clubbing. PULMONARY:  Unlabored, regular breathing. Clear bilaterally A&P w/o wheezes/rales/rhonchi. No accessory muscle use.  GI: BS present and normoactive. Soft, non-tender to palpation. No organomegaly or masses detected.  MSK: No erythema, warmth or tenderness. Cap refil <2 sec all extrem. No deformities or joint swelling noted.  Neuro: A/Ox3. No focal deficits noted.   Skin: Warm, no lesions or rashe Psych: Normal affect and behavior. Judgement and thought content appropriate.     Lab  Results:  CBC    Component Value Date/Time   WBC 5.7 01/31/2021 0902   WBC 14.7 (H) 09/12/2015 0549   RBC 5.15 01/31/2021 0902   RBC 4.89 09/12/2015 0549   HGB 15.4 01/31/2021 0902   HCT 44.5 01/31/2021 0902   PLT 230 01/31/2021 0902   MCV 86 01/31/2021 0902   MCH 29.9 01/31/2021 0902   MCH 30.9 09/12/2015 0549   MCHC 34.6 01/31/2021 0902   MCHC 36.4 (H) 09/12/2015 0549   RDW 12.3 01/31/2021 0902   LYMPHSABS 1.0 09/12/2015 0549   MONOABS 1.4 (H) 09/12/2015 0549   EOSABS 0.0 09/12/2015 0549   BASOSABS 0.0 09/12/2015 0549    BMET    Component Value Date/Time   NA 140 06/27/2022 0845   K 4.6 06/27/2022 0845   CL 103 06/27/2022 0845   CO2 23 06/27/2022 0845   GLUCOSE 99 06/27/2022 0845   GLUCOSE 121 (H) 09/12/2015 0549   BUN 14 06/27/2022 0845   CREATININE 1.00 06/27/2022 0845   CREATININE 0.98 12/31/2014 1615   CALCIUM 9.8 06/27/2022 0845   GFRNONAA 94 05/05/2018 1121   GFRAA 109 05/05/2018 1121    BNP No results found for: "BNP"   Imaging:  No results found.  Administration History     None           No data to display          No results found for: "NITRICOXIDE"      Assessment & Plan:   Moderate obstructive sleep apnea Moderate OSA. Suboptimal compliance with CPAP. Difficulties tolerating therapy despite mask change and pressure adjustment. He had moderate leaks and high residual AHI, which I suspect  a component of this is due to leaks. Difficulties tolerating higher pressures. Reviewed risks of untreated moderate OSA and treatment options including CPAP or hypoglossal nerve stimulator implantation. He is will to retry CPAP. Will set him up for CPAP titration study to determine appropriate settings/mask fit or see if he needs to transition to BiLevel support. Healthy weight loss encouraged. Aware of safe driving practices.  Patient Instructions  Use CPAP every night, minimum of 4-6 hours a night.  Change equipment as directed. Wash your  tubing with warm soap and water daily, hang to dry. Wash humidifier portion weekly. Use bottled, distilled water and change daily  Be aware of reduced alertness and do not drive or operate heavy machinery if experiencing this or drowsiness.  Exercise encouraged, as tolerated. Healthy weight management discussed.  Avoid or decrease alcohol consumption and medications that make you more sleepy, if possible. Notify if persistent daytime sleepiness occurs even with consistent use of PAP therapy.  We discussed how untreated sleep apnea puts an individual at risk for cardiac arrhthymias, pulm HTN, DM, stroke and increases their risk for daytime accidents. We also briefly reviewed treatment options including weight loss, side sleeping position, oral appliance, CPAP therapy or referral to ENT for possible surgical options  We will schedule you for an in lab titration study to see if we can get you more comfortable on CPAP therapy  Continue lunesta 1 mg At bedtime as needed for sleep. Take immediately before bed. Ensure you have 7-8 hours in the bed after taking. Monitor for any mood changes or changes in sleep habits. Stop and notify immediately if these occur. Do not drive or operate heavy machinery after taking. Do not take with alcohol or other sedating medications. Ensure you apply your CPAP within 5-10 minutes of taking to avoid falling asleep without it. May cause some morning grogginess or vivid dreams.   Start flonase nasal spray 2 sprays each nostril before bed Try over the counter chlorpheniramine 4 mg At bedtime for cough/drainage.  We will schedule you for lung function testing  Chest x ray today   Follow up in 8 weeks after PFT with Gregory Nixon (new pt) or Gregory Manuella Blackson,NP. If symptoms do not improve or worsen, please contact office for sooner follow up or seek emergency care.      Chronic cough Possibly irritable larynx with upper airway cough based on timeline/constellation of symptoms.  Associated sinus symptoms. No other respiratory symptoms reported. Will obtain CXR today and set him up for PFTs. Trial addition of intranasal steroid. Reassess response at follow up. If PFT normal and no significant change with flonase, can add on PPI or H2 blocker to target silent GERD symptoms which would contribute to cough and possible ENT referral. He is a never smoker and lower suspicion for asthma given onset and history.   Adjustment insomnia Infrequency use of lunesta. Will reassess after CPAP titration  Chronic rhinitis Trial intranasal steroid. Consider additional allergy testing if symptoms persist  Upper airway cough syndrome See above      I spent 45 minutes of dedicated to the care of this patient on the date of this encounter to include pre-visit review of records, face-to-face time with the patient discussing conditions above, post visit ordering of testing, clinical documentation with the electronic health record, making appropriate referrals as documented, and communicating necessary findings to members of the patients care team.  Noemi Chapel, NP 12/24/2023  Pt aware and understands NP's role.

## 2023-12-24 NOTE — Assessment & Plan Note (Signed)
 See above

## 2024-01-07 DIAGNOSIS — G4733 Obstructive sleep apnea (adult) (pediatric): Secondary | ICD-10-CM | POA: Diagnosis not present

## 2024-01-31 ENCOUNTER — Other Ambulatory Visit: Payer: Self-pay | Admitting: Cardiovascular Disease

## 2024-02-07 DIAGNOSIS — G4733 Obstructive sleep apnea (adult) (pediatric): Secondary | ICD-10-CM | POA: Diagnosis not present

## 2024-02-24 ENCOUNTER — Encounter: Payer: BC Managed Care – PPO | Admitting: Pulmonary Disease

## 2024-02-26 ENCOUNTER — Ambulatory Visit (HOSPITAL_BASED_OUTPATIENT_CLINIC_OR_DEPARTMENT_OTHER): Payer: BC Managed Care – PPO | Attending: Nurse Practitioner | Admitting: Pulmonary Disease

## 2024-02-26 DIAGNOSIS — G4733 Obstructive sleep apnea (adult) (pediatric): Secondary | ICD-10-CM | POA: Diagnosis not present

## 2024-03-01 ENCOUNTER — Telehealth: Payer: Self-pay | Admitting: Pulmonary Disease

## 2024-03-01 DIAGNOSIS — G4733 Obstructive sleep apnea (adult) (pediatric): Secondary | ICD-10-CM

## 2024-03-01 NOTE — Telephone Encounter (Signed)
 Call patient  Sleep study result  Date of study: 02/26/2024  Impression: Moderate obstructive sleep apnea, titrated to BiPAP Snoring, apneas and hypopneas resolved with BiPAP pressures  Recommendation: BiPAP 24/20 with heated humidification with heated tubing with patient's mask of choice Medium size Simplus fullface mask  Encouraged weight loss measures Close clinical follow-up and compliance monitoring for optimization of treatment

## 2024-03-01 NOTE — Procedures (Signed)
 Gregory Nixon [facility] [address] [place], [place] [address] Tel: [phone]   Fax: [phone]  Titration Interpretation  Patient Name:  Gregory Nixon, Gregory Nixon Date:  02/26/2024 Referring Physician:  Myer Artis, Md  Indications for Polysomnography The patient is a 58 year old Male who is 6\' 7"  and weighs 230.0 lbs. His BMI equals 26.1.  A full night titration treatment study was performed.  Medication  No Data.   Polysomnogram Data A full night polysomnogram recorded the standard physiologic parameters including EEG, EOG, EMG, EKG, nasal and oral airflow.  Respiratory parameters of chest and abdominal movements were recorded with Respiratory Inductance Plethysmography belts.  Oxygen saturation was recorded by pulse oximetry.   Sleep Architecture The total recording time of the polysomnogram was 360.6 minutes.  The total sleep time was 318.0 minutes.  The patient spent 4.6% of total sleep time in Stage N1, 65.3% in Stage N2, 17.8% in Stages N3, and 12.4% in REM.  Sleep latency was 1.3 minutes.  REM latency was 61.0 minutes.  Sleep Efficiency was 88.2%.  Wake after Sleep Onset time was 41.0 minutes.  Titration Summary The patient was titrated at pressures ranging from 7* cm/H20 with supplemental oxygen at - up to 24/20/0** cm/H20 with supplemental oxygen at -.  The last pressure used in the study was 24/20 cm/H20 with supplemental oxygen at -.  Respiratory Events The polysomnogram revealed a presence of 21 obstructive, 34 central, and 1 mixed apnea resulting in an Apnea index of 10.6 events per hour.  There were 86 hypopneas (>=3% desaturation and/or arousal) resulting in an Apnea\Hypopnea Index (AHI >=3% desaturation and/or arousal) of 26.8 events per hour.  There were 32 hypopneas (>=4% desaturation) resulting in an Apnea\Hypopnea Index (AHI >=4% desaturation) of 16.6 events per hour.  There were 52 Respiratory Effort Related Arousals resulting in a RERA  index of 9.8 events per hour. The Respiratory Disturbance Index is 36.6 events per hour.  The snore index was - events per hour.  Mean oxygen saturation was 95.5%.  The lowest oxygen saturation during sleep was 88.0%.  Time spent <=88% oxygen saturation was 0.1 minutes (-).  Limb Activity There were 1 limb movements recorded.  Of this total, - were classified as PLMs.  Of the PLMs, - were associated with arousals.  The Limb Movement index was 0.2 per hour while the PLM index was - per hour.  Cardiac Summary The average pulse rate was 54.7 bpm.  The minimum pulse rate was 44.0 bpm while the maximum pulse rate was 97.0 bpm.  Cardiac rhythm was normal/abnormal.  Diagnosis:  Moderate obstructive sleep apnea Titrated to BiPAP Snoring, apneas and hypopneas resolved with BiPAP pressures Limited REM sleep achieved during titration No significant periodic limb movements There was some fragmentation of sleep with higher pressures.   Recommendations: BiPAP 24/20 with heated humidification with heated tubing with patient's mask of choice May require pressure titration with close compliance monitoring Medium size Simplus fullface mask Encouraged weight loss measures Close clinical follow-up for optimization of treatment  This study was personally reviewed and electronically signed by: Myer Artis, Md Accredited Board Certified in Sleep Medicine Date/Time: 03/01/2024   Titration Report  Patient Name: Nixon, Gregory Date: 02/26/2024  Date of Birth: January 27, 1966 Study Type: Bilevel Titration  Age: 15 year MRN #: [id number]  Sex: Male Interpreting Physician: Myer Artis [id number]  Height: 6\' 7"  Referring Physician: Myer Artis, Md  Weight: 230.0 lbs Recording Tech: Iva Mariner RPSGT RST  BMI: 26.1  Scoring Tech: Iva Mariner RPSGT RST  ESS: 3 Neck Size: 18  Mask Type Simplus FFM Final Pressure: 24/20 CM H2O  Mask Size: medium Supplemental O2: -   Study Overview  Lights  Off: 10:33:55 PM  Count Index  Lights On: 04:34:35 AM Awakenings: 22 4.2  Time in Bed: 360.6 min. Arousals: 131 24.7  Total Sleep Time: 318.0 min. AHI (>=3% Desat and/or Ar.): 142 26.8   Sleep Efficiency: 88.2% AHI (>=4% Desat): 88 16.6   Sleep Latency: 1.3 min. Limb Movements: 1 0.2  Wake After Sleep Onset: 41.0 min. Snore: - -  REM Latency from Sleep Onset: 61.0 min. Desaturations: 118 22.3     Minimum SpO2 TST: 88.0%    Sleep Architecture  % of Time in Bed Stages Time (mins) % Sleep Time  Wake 43.0   Stage N1 14.5 4.6%  Stage N2 207.5 65.3%  Stage N3 56.5 17.8%  REM 39.5 12.4%   Arousal Summary   NREM REM Sleep Index  Respiratory Arousals 88 15 103 19.4  PLM Arousals - - - -  Isolated Limb Movement Arousals 1 - 1 0.2  Snore Arousals - - - -  Spontaneous Arousals 27 1 28  5.3  Total 115 16 131 24.7   Limb Movement Summary   Count Index  Isolated Limb Movements 1 0.2  Periodic Limb Movements (PLMs) - -  Total Limb Movements 1 0.2    Respiratory Summary   By Sleep Stage By Body Position Total   NREM REM Supine Non-Supine   Time (min) 278.5 39.5 274.0 44.0 318.0         Obstructive Apnea 16 5 20 1 21   Mixed Apnea 1 - 1 - 1  Central Apnea 33 1 34 - 34  Total Apneas 50 6 55 1 56  Total Apnea Index 10.8 9.1 12.0 1.4 10.6         Hypopneas (>=3% Desat and/or Ar.) 66 20 80 6 86  AHI (>=3% Desat and/or Ar.) 25.0 39.5 29.6 9.5 26.8         Hypopneas (>=4% Desat) 23 9 29 3  32  AHI (>=4% Desat) 15.7 22.8 18.4 5.5 16.6          RERAs 48 4 47 5 52  RERA Index 10.3 6.1 10.3 6.8 9.8         RDI 35.3 45.6 39.9 16.4 36.6     Respiratory Event Durations   Apnea Hypopnea   NREM REM NREM REM  Average (seconds) 18.0 22.6 34.1 39.7  Maximum (seconds) 48.3 28.5 114.3 61.4    Oxygen Saturation Summary   Wake NREM REM TST TIB  Average SpO2 96.8% 95.2% 95.7% 95.3% 95.5%  Minimum SpO2 89.0% 89.0% 88.0% 88.0% 88.0%  Maximum SpO2 99.0% 99.0% 99.0% 99.0% 99.0%   Oxygen  Saturation Distribution  Range (%) Time in range (min) Time in range (%)  90.0 - 100.0 356.0 99.1%  80.0 - 90.0 3.1 0.9%  70.0 - 80.0 - -  60.0 - 70.0 - -  50.0 - 60.0 - -  0.0 - 50.0 - -  Time Spent <=88% SpO2  Range (%) Time in range (min) Time in range (%)  0.0 - 88.0 0.1 0.0%      Count Index  Desaturations 118 22.3    Cardiac Summary   Wake NREM REM Sleep Total  Average Pulse Rate (BPM) 59.5 53.9 55.6 54.1 54.7  Minimum Pulse Rate (BPM) 49.0 44.0 46.0 44.0 44.0  Maximum Pulse Rate (  BPM) 97.0 87.0 85.0 87.0 97.0   Pulse Rate Distribution:  Range (bpm) Time in range (min) Time in range (%)  0.0 - 40.0 - -  40.0 - 60.0 319.7 88.6%  60.0 - 80.0 39.5 10.9%  80.0 - 100.0 1.7 0.5%  100.0 - 120.0 - -  120.0 - 140.0 - -  140.0 - 200.0 - -   Titration Summary  PAP Device PAP Level O2 Level Time (min) Wake (min) NREM (min) REM (min) Sleep Eff% OA# CA# MA# Hyp# (>=3%) AHI (>=3%) Hyp# (>=4%) AHI (>=%4) RERA RDI SpO2 <=88% (min) Min SpO2 Mean SpO2 Ar. Index  CPAP 7 - 13.0 2.5 10.5 0.0 80.8% 2 - - 5 40.0 2  22.9 1  45.7  0.0 90.0 92.9 28.6  CPAP 9 - 19.0 0.0 19.0 0.0 100.0% - - - 6 18.9 1  3.2 7  41.1  0.0 89.0 93.5 28.4  CPAP 11 - 18.0 0.0 18.0 0.0 100.0% - - - 6 20.0 -  - 6  40.0  0.0 91.0 93.8 33.3  CPAP 12 - 30.5 4.0 24.5 2.0 86.9% 5 4 - 11 45.3 2  24.9 3  52.1  0.0 92.0 95.6 29.4  CPAP 14 - 18.0 0.0 18.0 0.0 100.0% - 6 - 3 30.0 1  23.3 7  53.3  0.0 91.0 95.0 36.7  CPAP 15 - 28.5 0.5 22.5 5.5 98.2% - - - 7 15.0 4  8.6 2  19.3  0.1 88.0 93.9 10.7  CPAP 16 - 72.5 13.0 49.0 10.5 82.1% 1 - - 12 13.1 6  7.1 6  19.2  0.0 89.0 95.7 18.2  CPAP 18 - 37.5 1.0 21.5 15.0 97.3% 8 2 - 11 34.5 1  18.1 7  46.0  0.1 88.0 96.5 31.2  BiLevel 20.5/16 - 13.5 0.0 13.5 0.0 100.0% 1 6 - 6 57.8 3  44.4 2  66.7  0.0 92.0 96.1 -  BiLevel 21/17 - 16.0 0.0 16.0 0.0 100.0% 3 3 1 9  60.0 6  48.8 -  60.0  0.0 93.0 95.8 11.3  BiLevel 23/19 - 63.5 15.5 41.5 6.5 75.6% 1 13 - 10 30.0 6  25.0 9  41.3  0.0 91.0  96.0 33.8  BiLevel 24/20 - 31.0 6.5 24.5 0.0 79.0% - - - - - -  - 2  4.9  0.0 91.0 95.2 26.9    Hypnograms                           Technologist Comments  The patient was here at the sleep lab for OSA. A CPAP titration study was ordered. The patient was fitted with a medium Simplus FFM.  The CPAP pressure was started at 7cm H2O and increased to a CPAP pressure of 18cm H2O. The patient was switched to BIPAP of 20/16cm H2O. The BIPAP pressure were titrated to 24/20cm H2O with heated humidity and heated tubbing.  Respiratory events were eliminated.  Snoring was eliminated.  Periodic Limb Movement was rare.  EKG showed NSR.  No restroom visited.

## 2024-03-02 NOTE — Telephone Encounter (Signed)
 Gregory Clarke, NP to Me      03/02/24  2:16 PM Moderate OSA. Not adequately controlled on CPAP. Transitioned to BiPAP. Please place orders for BiPAP auto IPAP max 24, EPAP min 16, PS 4 with heated humidification with heated tubing with patient's mask of choice. Thanks!   Called the pt and there was no answer- LMTCB

## 2024-03-04 ENCOUNTER — Ambulatory Visit: Payer: BC Managed Care – PPO

## 2024-03-04 ENCOUNTER — Ambulatory Visit: Payer: BC Managed Care – PPO | Admitting: Pulmonary Disease

## 2024-03-05 NOTE — Telephone Encounter (Signed)
 Called the pt and there was no answer- LMTCB

## 2024-03-05 NOTE — Telephone Encounter (Signed)
 Copied from CRM [id number]. Topic: Clinical - Lab/Test Results >> Mar 04, 2024 10:22 AM Hilton Lucky wrote: Reason for CRM: Patient returning call for cpap results. Please return call to patient.

## 2024-03-08 DIAGNOSIS — G4733 Obstructive sleep apnea (adult) (pediatric): Secondary | ICD-10-CM | POA: Diagnosis not present

## 2024-03-09 NOTE — Telephone Encounter (Signed)
 ATC X2. LMTCB. Pt does not have a Mychart account. Pt had a CPAP titration study on 02-27-24 so the results are not ready yet. Completing note per protocol.

## 2024-03-09 NOTE — Telephone Encounter (Signed)
 Copied from CRM [id number]. Topic: General - Other >> Mar 05, 2024  5:04 PM Alverda Joe S wrote: Reason for CRM: patient wants to know his results. Explained but I dont understand, please call patient to further discuss. He will be available after 4pm  ATC pt- no answer, left message to call back.

## 2024-03-22 ENCOUNTER — Other Ambulatory Visit: Payer: Self-pay | Admitting: Nurse Practitioner

## 2024-03-22 DIAGNOSIS — R053 Chronic cough: Secondary | ICD-10-CM

## 2024-03-22 DIAGNOSIS — J310 Chronic rhinitis: Secondary | ICD-10-CM

## 2024-03-26 ENCOUNTER — Ambulatory Visit: Admitting: Pulmonary Disease

## 2024-03-26 DIAGNOSIS — G4733 Obstructive sleep apnea (adult) (pediatric): Secondary | ICD-10-CM | POA: Diagnosis not present

## 2024-03-26 DIAGNOSIS — I10 Essential (primary) hypertension: Secondary | ICD-10-CM

## 2024-03-26 DIAGNOSIS — Z9981 Dependence on supplemental oxygen: Secondary | ICD-10-CM

## 2024-03-26 DIAGNOSIS — R053 Chronic cough: Secondary | ICD-10-CM

## 2024-03-26 DIAGNOSIS — F5102 Adjustment insomnia: Secondary | ICD-10-CM

## 2024-03-26 DIAGNOSIS — E669 Obesity, unspecified: Secondary | ICD-10-CM

## 2024-03-26 LAB — PULMONARY FUNCTION TEST
DL/VA % pred: 124 %
DL/VA: 5.4 ml/min/mmHg/L
DLCO unc % pred: 96 %
DLCO unc: 24.55 ml/min/mmHg
FEF 25-75 Post: 3.09 L/s
FEF 25-75 Pre: 2.83 L/s
FEF2575-%Change-Post: 9 %
FEF2575-%Pred-Post: 110 %
FEF2575-%Pred-Pre: 101 %
FEV1-%Change-Post: 5 %
FEV1-%Pred-Post: 79 %
FEV1-%Pred-Pre: 74 %
FEV1-Post: 2.62 L
FEV1-Pre: 2.48 L
FEV1FVC-%Change-Post: 0 %
FEV1FVC-%Pred-Pre: 107 %
FEV6-%Change-Post: 7 %
FEV6-%Pred-Post: 77 %
FEV6-%Pred-Pre: 72 %
FEV6-Post: 3.22 L
FEV6-Pre: 3 L
FEV6FVC-%Change-Post: 0 %
FEV6FVC-%Pred-Post: 104 %
FEV6FVC-%Pred-Pre: 104 %
FVC-%Change-Post: 6 %
FVC-%Pred-Post: 74 %
FVC-%Pred-Pre: 69 %
FVC-Post: 3.22 L
FVC-Pre: 3.02 L
Post FEV1/FVC ratio: 81 %
Post FEV6/FVC ratio: 100 %
Pre FEV1/FVC ratio: 82 %
Pre FEV6/FVC Ratio: 99 %
RV % pred: 89 %
RV: 1.82 L
TLC % pred: 73 %
TLC: 4.7 L

## 2024-03-26 MED ORDER — ESZOPICLONE 1 MG PO TABS
1.0000 mg | ORAL_TABLET | Freq: Every evening | ORAL | 1 refills | Status: AC | PRN
Start: 2024-03-26 — End: ?

## 2024-03-26 NOTE — Progress Notes (Signed)
 Full pft performed today.

## 2024-03-26 NOTE — Patient Instructions (Signed)
 Full pft performed today.

## 2024-03-26 NOTE — Progress Notes (Signed)
 Gregory Nixon    [id number]    07/16/1966  Primary Care Physician:Hammer, Kelle Pate, MD  Referring Physician: Benedetto Brady, MD [address] [place],  [place] [address]  Chief complaint:   Follow-up for obstructive sleep apnea  HPI:  Tolerating BiPAP Still getting used to new pressures  Has been diagnosed with moderate obstructive sleep apnea on a home sleep test with AHI of 25.7, low sats of 73% At follow-up noted to have not been compliant with CPAP, AHI remained high was why he was scheduled for a retitration study  Feels that he is still in the process of getting used to the BiPAP pressures  Sleep is still nonrestorative at present  Denies any other active symptoms at present No significant change in his breathing No significant shortness of breath, no wheezing, no fevers, no cough  Outpatient Encounter Medications as of 03/26/2024  Medication Sig   aspirin  EC 81 MG tablet Take 81 mg by mouth daily.   atorvastatin  (LIPITOR) 80 MG tablet Take 1 tablet (80 mg total) by mouth daily at 6 PM.   Evolocumab  140 MG/ML SOAJ Inject 140 mg into the skin every 14 (fourteen) days.   ezetimibe  (ZETIA ) 10 MG tablet TAKE 1 TABLET BY MOUTH EVERY DAY   fenofibrate  160 MG tablet Take 1 tablet (160 mg total) by mouth every morning.   fluticasone  (FLONASE ) 50 MCG/ACT nasal spray SPRAY 2 SPRAYS INTO EACH NOSTRIL EVERY DAY   ibuprofen (ADVIL,MOTRIN) 200 MG tablet Take 400 mg by mouth every 6 (six) hours as needed (for pain.).   icosapent  Ethyl (VASCEPA ) 1 g capsule TAKE 2 CAPSULES BY MOUTH 2 TIMES DAILY.   levothyroxine (SYNTHROID) 88 MCG tablet Take 88 mcg by mouth every morning.   losartan  (COZAAR ) 50 MG tablet Take 1 tablet (50 mg total) by mouth every morning.   metoprolol  tartrate (LOPRESSOR ) 25 MG tablet Take 1 tablet (25 mg total) by mouth 2 (two) times daily.   nitroGLYCERIN  (NITROSTAT ) 0.4 MG SL tablet Place 1 tablet (0.4 mg total) under the tongue every 5 (five)  minutes as needed for chest pain.   [DISCONTINUED] eszopiclone  (LUNESTA ) 1 MG TABS tablet Take 1 tablet (1 mg total) by mouth at bedtime as needed for sleep. Take immediately before bedtime   eszopiclone  (LUNESTA ) 1 MG TABS tablet Take 1 tablet (1 mg total) by mouth at bedtime as needed for sleep. Take immediately before bedtime   No facility-administered encounter medications on file as of 03/26/2024.    Allergies as of 03/26/2024   (No Known Allergies)    Past Medical History:  Diagnosis Date   Arthritis    hands   At risk for sleep apnea    STOP-BANG= 5     SENT TO PCP 09-29-2015   Coronary artery disease cardiologist-  dr Magnus Schuller   08-24-2013 s/p  PCI and DES x2  to LAD and pCFX     Family history of premature CAD    History of gout    lower extremities-- per pt no issues since 2014   Hypertension    Right ureteral stone    S/P drug eluting coronary stent placement    08-24-2013  to LAD and pCFX    Past Surgical History:  Procedure Laterality Date   CARDIOVASCULAR STRESS TEST  06-21-2015   dr  Magnus Schuller   normal nuclear study/  no ischemia or scar/ normal LV function and wall motion ,  ef 57%   CYSTECTOMY  age 58   left leg   CYSTOSCOPY WITH RETROGRADE PYELOGRAM, URETEROSCOPY AND STENT PLACEMENT Right 10/04/2015   Procedure: CYSTOSCOPY WITH RETROGRADE PYELOGRAM WITH INTERPERTATION RIGHT URETEROSCOPY  BASKET EXTRACTION DOUBLE J STENT;  Surgeon: Christina Coyer, MD;  Location: [facility];  Service: Urology;  Laterality: Right;   HOLMIUM LASER APPLICATION Right 10/04/2015   Procedure: HOLMIUM LASER APPLICATION;  Surgeon: Christina Coyer, MD;  [facility];  Service: Urology;  Laterality: Right;   INGUINAL HERNIA REPAIR  09/04/2011   Procedure: LAPAROSCOPIC INGUINAL HERNIA;  Surgeon: Azucena Bollard, MD;  Location: [facility];  Service: General;  Laterality: Right;   INGUINAL HERNIA REPAIR  09/04/2011   Procedure: HERNIA REPAIR INGUINAL  ADULT;  Surgeon: Azucena Bollard, MD;  Location: [facility];  Service: General;  Laterality: N/A;   LEFT AND RIGHT HEART CATHETERIZATION WITH CORONARY ANGIOGRAM N/A 08/24/2013   Procedure: LEFT AND RIGHT HEART CATHETERIZATION WITH CORONARY ANGIOGRAM;  Surgeon: Millicent Ally, MD;  Location: [facility] C[facility];  Service: Cardiovascular;  Laterality: N/A;   PERCUTANEOUS CORONARY STENT INTERVENTION (PCI-S) N/A 08/24/2013   Procedure: PERCUTANEOUS CORONARY STENT INTERVENTION (PCI-S);  Surgeon: Millicent Ally, MD;  Location: [facility] CATH LAB;  Service: Cardiovascular;  Laterality: N/A;   Severe 3 vessel CAD/  Total occlusion mRCA and dRCA with collaterals ,  DEStenting to LAD and pCFX,  ef 55%   REPAIR RIGHT SCROTAL HERNIA  05-30-2011    Family History  Problem Relation Age of Onset   Heart disease Mother    Heart disease Father    Thyroid  disease Father    Heart disease Sister    Thyroid  disease Sister    Diabetes Sister     Social History   Socioeconomic History   Marital status: Married    Spouse name: Not on file   Number of children: Not on file   Years of education: Not on file   Highest education level: Not on file  Occupational History   Not on file  Tobacco Use   Smoking status: Never   Smokeless tobacco: Never  Substance and Sexual Activity   Alcohol use: No   Drug use: No   Sexual activity: Not on file  Other Topics Concern   Not on file  Social History Narrative   Not on file   Social Drivers of Health   Financial Resource Strain: Not on file  Food Insecurity: Not on file  Transportation Needs: Not on file  Physical Activity: Not on file  Stress: Not on file  Social Connections: Not on file  Intimate Partner Violence: Not on file    Review of Systems  Constitutional:  Positive for fatigue.  Respiratory:  Positive for apnea.   Psychiatric/Behavioral:  Positive for sleep disturbance.     Vitals:   03/26/24 0938  BP: (!) 167/109  Pulse: 68  SpO2: 95%     Physical  Exam Constitutional:      Appearance: He is obese.  HENT:     Head: Normocephalic.     Mouth/Throat:     Mouth: Mucous membranes are moist.  Eyes:     General: No scleral icterus. Cardiovascular:     Rate and Rhythm: Normal rate and regular rhythm.     Heart sounds: No murmur heard.    No friction rub.  Pulmonary:     Effort: No respiratory distress.     Breath sounds: No stridor. No wheezing or  rhonchi.  Musculoskeletal:     Cervical back: No rigidity or tenderness.  Neurological:     Mental Status: He is alert.  Psychiatric:        Mood and Affect: Mood normal.      Data Reviewed: Most recent sleep study Titrated to BiPAP 24/20 with significant improvement in apnea hypopnea index  Compliance download is suboptimal Patient relates states probably related to data issues -Only shows 1 day of use and pressure settings is still minimum pressure of 5, maximum pressure of 12 with EPR of 3, AHI of 13.8,-this was the data prior to his recent retitration and pressure change - Tries to use the machine on a nightly basis  Pulmonary function test shows mild restriction-reviewed with the patient  Assessment:  Obstructive sleep apnea - On BiPAP therapy - Recent titration noted  Obesity - Continue to work on weight loss measures  Uncontrolled hypertension - Patient states his blood pressure usually runs in the 120s systolic - Compliant with blood pressure medications  Sleep onset and sleep maintenance insomnia - On Lunesta  1 mg   Plan/Recommendations: Encouraged to continue using BiPAP on a nightly basis  Give us  a call if pressures are not well-tolerated and we can make adjustments as we go along  Need to keep a close eye on compliance monitoring  Refills for Lunesta  sent to pharmacy - Continues to benefit from this - No significant side effects noted  Will follow-up with compliance monitoring about 3 months from here  Encouraged to give us  a call if he has any  specific concerns with the BiPAP     Myer Artis MD [facility] 03/26/2024, 9:55 AM  CC: Benedetto Brady, MD

## 2024-03-26 NOTE — Patient Instructions (Signed)
 Continue using your BiPAP every night Let us  know if you are having significant problems with the pressures  Your sleep aid should continue to help  I will see you back in 3 months  The download we were able to get from the medical supply company is not showing significant data  Make sure you are using your BiPAP  Continue to take your blood pressure medications on a regular basis

## 2024-04-08 DIAGNOSIS — G4733 Obstructive sleep apnea (adult) (pediatric): Secondary | ICD-10-CM | POA: Diagnosis not present

## 2024-05-08 DIAGNOSIS — G4733 Obstructive sleep apnea (adult) (pediatric): Secondary | ICD-10-CM | POA: Diagnosis not present

## 2024-08-03 ENCOUNTER — Other Ambulatory Visit: Payer: Self-pay

## 2024-08-03 DIAGNOSIS — E7800 Pure hypercholesterolemia, unspecified: Secondary | ICD-10-CM | POA: Diagnosis not present

## 2024-08-03 DIAGNOSIS — Z125 Encounter for screening for malignant neoplasm of prostate: Secondary | ICD-10-CM | POA: Diagnosis not present

## 2024-08-03 DIAGNOSIS — E039 Hypothyroidism, unspecified: Secondary | ICD-10-CM | POA: Diagnosis not present

## 2024-08-03 DIAGNOSIS — I119 Hypertensive heart disease without heart failure: Secondary | ICD-10-CM | POA: Diagnosis not present

## 2024-08-05 DIAGNOSIS — Z125 Encounter for screening for malignant neoplasm of prostate: Secondary | ICD-10-CM | POA: Diagnosis not present

## 2024-08-05 DIAGNOSIS — E7800 Pure hypercholesterolemia, unspecified: Secondary | ICD-10-CM | POA: Diagnosis not present

## 2024-08-05 DIAGNOSIS — E039 Hypothyroidism, unspecified: Secondary | ICD-10-CM | POA: Diagnosis not present

## 2024-08-05 DIAGNOSIS — Z0189 Encounter for other specified special examinations: Secondary | ICD-10-CM | POA: Diagnosis not present

## 2024-08-05 DIAGNOSIS — I119 Hypertensive heart disease without heart failure: Secondary | ICD-10-CM | POA: Diagnosis not present

## 2024-08-05 MED ORDER — EZETIMIBE 10 MG PO TABS: 10.0000 mg | ORAL_TABLET | Freq: Every day | ORAL | 0 refills | Status: DC

## 2024-09-02 ENCOUNTER — Other Ambulatory Visit: Payer: Self-pay | Admitting: Physician Assistant

## 2024-09-12 ENCOUNTER — Other Ambulatory Visit: Payer: Self-pay | Admitting: Physician Assistant

## 2024-09-22 ENCOUNTER — Other Ambulatory Visit: Payer: Self-pay

## 2024-09-27 ENCOUNTER — Other Ambulatory Visit: Payer: Self-pay | Admitting: Physician Assistant

## 2024-09-28 MED ORDER — EZETIMIBE 10 MG PO TABS: 10.0000 mg | ORAL_TABLET | Freq: Every day | ORAL | 0 refills | Status: DC

## 2024-10-02 DIAGNOSIS — E039 Hypothyroidism, unspecified: Secondary | ICD-10-CM | POA: Diagnosis not present

## 2024-10-30 ENCOUNTER — Other Ambulatory Visit: Payer: Self-pay | Admitting: General Practice

## 2024-10-30 MED ORDER — ICOSAPENT ETHYL 1 G PO CAPS: 2.0000 g | ORAL_CAPSULE | Freq: Two times a day (BID) | ORAL | 0 refills | Status: AC

## 2024-11-01 ENCOUNTER — Other Ambulatory Visit: Payer: Self-pay | Admitting: Physician Assistant

## 2024-11-03 ENCOUNTER — Other Ambulatory Visit (HOSPITAL_BASED_OUTPATIENT_CLINIC_OR_DEPARTMENT_OTHER): Payer: Self-pay

## 2024-11-24 ENCOUNTER — Other Ambulatory Visit: Payer: Self-pay | Admitting: General Practice

## 2024-11-24 DIAGNOSIS — E7800 Pure hypercholesterolemia, unspecified: Secondary | ICD-10-CM

## 2024-11-24 DIAGNOSIS — I2510 Atherosclerotic heart disease of native coronary artery without angina pectoris: Secondary | ICD-10-CM

## 2024-11-27 ENCOUNTER — Other Ambulatory Visit (HOSPITAL_COMMUNITY): Payer: Self-pay

## 2024-11-27 ENCOUNTER — Encounter: Payer: Self-pay | Admitting: Cardiology

## 2024-11-27 ENCOUNTER — Ambulatory Visit: Attending: Cardiology | Admitting: Cardiology

## 2024-11-27 VITALS — BP 142/88 | HR 64 | Resp 16 | Ht 67.0 in | Wt 236.0 lb

## 2024-11-27 DIAGNOSIS — I10 Essential (primary) hypertension: Secondary | ICD-10-CM

## 2024-11-27 DIAGNOSIS — G4733 Obstructive sleep apnea (adult) (pediatric): Secondary | ICD-10-CM

## 2024-11-27 DIAGNOSIS — R0989 Other specified symptoms and signs involving the circulatory and respiratory systems: Secondary | ICD-10-CM

## 2024-11-27 DIAGNOSIS — E782 Mixed hyperlipidemia: Secondary | ICD-10-CM

## 2024-11-27 DIAGNOSIS — E7800 Pure hypercholesterolemia, unspecified: Secondary | ICD-10-CM

## 2024-11-27 DIAGNOSIS — I2510 Atherosclerotic heart disease of native coronary artery without angina pectoris: Secondary | ICD-10-CM | POA: Diagnosis not present

## 2024-11-27 MED ORDER — EZETIMIBE 10 MG PO TABS
10.0000 mg | ORAL_TABLET | Freq: Every day | ORAL | 3 refills | Status: AC
  Filled 2024-11-27: qty 90, 90d supply, fill #0

## 2024-11-27 MED ORDER — EVOLOCUMAB 140 MG/ML ~~LOC~~ SOAJ
1.0000 mL | SUBCUTANEOUS | 3 refills | Status: AC
  Filled 2024-11-27: qty 6, 84d supply, fill #0
  Filled 2024-11-27: qty 12, 168d supply, fill #0

## 2024-11-27 MED ORDER — LOSARTAN POTASSIUM-HCTZ 100-12.5 MG PO TABS
1.0000 | ORAL_TABLET | ORAL | 3 refills | Status: AC
  Filled 2024-11-27: qty 90, 90d supply, fill #0

## 2024-11-27 MED ORDER — METOPROLOL TARTRATE 25 MG PO TABS
25.0000 mg | ORAL_TABLET | Freq: Two times a day (BID) | ORAL | 3 refills | Status: AC
  Filled 2024-11-27: qty 180, 90d supply, fill #0

## 2024-11-27 MED ORDER — AMLODIPINE BESYLATE 5 MG PO TABS
5.0000 mg | ORAL_TABLET | Freq: Every evening | ORAL | 3 refills | Status: AC
  Filled 2024-11-27: qty 90, 90d supply, fill #0

## 2024-11-27 NOTE — Progress Notes (Unsigned)
 " Cardiology Office Note:  .   Date:  11/28/2024  ID:  Gregory Nixon, DOB December 07, 1965, MRN [id number] PCP: Leonel Cole, MD  [facility] Providers Cardiologist:  Gordy Bergamo, MD { Click to update primary MD,subspecialty MD or APP then REFRESH:1}  History of Present Illness: .   Gregory Nixon is a 59 y.o. male patient with hypertension, mixed hypercholesterolemia, OSA not on CPAP and strong family history for CAD, mother who died at age 43 with a massive MI and his father is status post CABG in yearly 34 years of age, sisster has had multiple coronary stents, hypertension, hypercholesterolemia, moderate obesity and patient himself with CAD diagnosed at the age of 61 by cardiac catheterization, with a 80% proximal CX, 99% mid LAD and occluded mid RCA with a preserved LVEF, underwent successful angioplasty to mid LAD and mid Cx.  He now presents to reestablish care with me, previously patient of Dr. Debby Sor.    Discussed the use of AI scribe software for clinical note transcription with the patient, who gave verbal consent to proceed.  History of Present Illness Gregory Nixon is a 59 year old male with coronary artery disease and hyperlipidemia who presents for cardiovascular follow-up.  He has coronary artery disease with prior stents placed at age 13. His current regimen is aspirin  81 mg daily, losartan  50 mg daily, metoprolol  tartrate 25 mg twice daily, atorvastatin  80 mg daily, ezetimibe  10 mg daily, and Repatha  every two weeks. His LDL recently has been about 111 after prior markedly elevated cholesterol.  He notes fluctuating blood pressure at home with both normal and elevated readings while on losartan  and metoprolol .  He does not smoke and has no diabetes. There is a family history of cardiovascular disease in his sister and paternal grandmother. Both parents died with tobacco use as a contributing factor.  He lives alone and uses stairs at home for regular physical  activity.  He takes Repatha  regularly and has adequate supplies of Vascepa  and fenofibrate . He needs refills for ezetimibe , metoprolol , and losartan .  He has sleep apnea and previously used CPAP but stopped and is currently not using the machine at home due to dissatisfaction with prior sleep care.  Cardiac Studies relevent.    Cardiac Studies & Procedures   ______________________________________________________________________________________________ MYOCARDIAL PERFUSION IMAGING 06/21/2015  Nuclear stress EF: 57%. The study is normal.    Coronary angiogram 08/24/2013: Severe 3 vessel CAD/ Total occlusion mRCA and dRCA with collaterals , DEStenting to LAD and pCFX, ef 55%.  Mid LAD: 2.75x12 mm, Left circumflex 3.5x15 mm sites expedition DES stent.   ______________________________________________________________________________________________    EKG:   EKG Interpretation Date/Time:  Friday November 27 2024 08:35:59 EST Ventricular Rate:  64 PR Interval:  176 QRS Duration:  76 QT Interval:  388 QTC Calculation: 400 R Axis:   43  Text Interpretation: EKG 11/27/2024: Normal sinus rhythm with rate of 64 beats minute, normal EKG.  Compared to 08/22/2023, no change. Confirmed by Bergamo Gordy [phone]) on 11/27/2024 8:43:24 AM  Labs   Lab Results  Component Value Date   CHOL 192 06/27/2022   HDL 25 (L) 06/27/2022   LDLCALC 122 (H) 06/27/2022   TRIG 255 (H) 06/27/2022   CHOLHDL 7.7 (H) 06/27/2022   No results found for: LIPOA   Care everywhere/Faxed External Labs:  Labs 08/04/2024:  Serum glucose 92 mg, BUN 11, creatinine 0.95, eGFR 92 mL, potassium 4.7, LFTs normal.  TSH mildly elevated at 5.62.  Total  cholesterol 175, triglycerides 193, HDL 30, LDL 111.  Hb 15.2/HCT 43.8, platelets 220, normal indicis.  ROS  Review of Systems  Cardiovascular:  Negative for chest pain, dyspnea on exertion and leg swelling.   Physical Exam:   VS:  BP (!) 142/88 (BP Location: Right Arm,  Patient Position: Sitting, Cuff Size: Normal)    Wt Readings from Last 3 Encounters:  02/27/24 230 lb (104.3 kg)  12/24/23 238 lb (108 kg)  10/02/23 238 lb 3.2 oz (108 kg)    BP Readings from Last 3 Encounters:  11/27/24 (!) 142/88  03/26/24 (!) 167/109  12/24/23 128/74   Physical Exam Neck:     Vascular: Carotid bruit (right) present. No JVD.  Cardiovascular:     Rate and Rhythm: Normal rate and regular rhythm.     Pulses: Intact distal pulses.     Heart sounds: Normal heart sounds. No murmur heard.    No gallop.  Pulmonary:     Effort: Pulmonary effort is normal.     Breath sounds: Normal breath sounds.  Abdominal:     General: Bowel sounds are normal.     Palpations: Abdomen is soft.  Musculoskeletal:     Right lower leg: No edema.     Left lower leg: No edema.    ASSESSMENT AND PLAN: .      ICD-10-CM   1. CAD in native artery  I25.10 EKG 12-Lead    Lipid panel    Evolocumab  140 MG/ML SOAJ    Basic metabolic panel with GFR    2. Mixed hyperlipidemia  E78.2 Lipid panel    Basic metabolic panel with GFR    3. Moderate obstructive sleep apnea  G47.33 Lipid panel    Basic metabolic panel with GFR    4. Essential hypertension  I10 Lipid panel    Basic metabolic panel with GFR    5. Right carotid bruit  R09.89 VAS US  CAROTID    Lipid panel    Basic metabolic panel with GFR    6. Pure hypercholesterolemia  E78.00 Lipid panel    Evolocumab  140 MG/ML SOAJ    Basic metabolic panel with GFR     Assessment & Plan Atherosclerotic heart disease of native coronary artery Coronary artery disease with stents placed at age 26. Currently on aspirin  81 mg daily. Concerns about blood pressure control and potential carotid artery disease. - Continue aspirin  81 mg daily - Ordered carotid duplex ultrasound to evaluate for carotid artery disease  Mixed hyperlipidemia LDL previously well-controlled but currently elevated. On atorvastatin  80 mg, ezetimibe  10 mg, Repatha  weekly,  and fenofibrate . High triglycerides likely due to dietary factors. - Continue atorvastatin  80 mg daily - Continue ezetimibe  10 mg daily - Continue Repatha  weekly - Continue fenofibrate  - Will order fasting lipid panel in 2-3 weeks - Advised dietary modifications to reduce triglycerides, including reducing intake of fats, carbohydrates, and starches  Essential hypertension Blood pressure not well controlled. Previously on losartan  50 mg and metoprolol  tartrate 25 mg twice daily. Losartan  discontinued due to inadequate control. Amlodipine  added to regimen. Discussed potential side effect of leg swelling with amlodipine . - Discontinued losartan  50 mg - Prescribed losartan  HCT 100/12.5 mg daily - Continue metoprolol  tartrate 25 mg twice daily - Added amlodipine  5 mg daily - Monitor for leg swelling as a side effect of amlodipine   Carotid artery disease (evaluation for carotid bruit) Presence of carotid bruit suggests possible carotid arteriosclerosis. - Ordered carotid duplex ultrasound to evaluate for carotid artery disease  OSA not on CPAP We have to stop importance of use of CPAP, I will treat this more on his next office visit as it was already a very prolonged visit with discussions regarding his risk factors, lifestyle changes.  Follow up: 6 Weeks. CAD, Mixed hyperchol, Hypertension, F/U BMP and Lipids and carotid duplex   Signed,  Gordy Bergamo, MD, [facility] 11/28/2024, 2:56 PM [facility] [address] [place], [place] [address] Phone: [phone]. Fax:  [phone]  "

## 2024-11-27 NOTE — Patient Instructions (Addendum)
 Medication Instructions:  STOP START   losartan -hydrochlorothiazide  (HYZAAR ) 100-12.5 MG tablet         Take 1 tablet by mouth every morning.           *If you need a refill on your cardiac medications before your next appointment, please call your pharmacy*  Lab Work: Your physician recommends that you return for a FASTING lipid profile and BMP in 3 weeks. (2/13-2/20)   Lab Orders         Lipid panel       If you have labs (blood work) drawn today and your tests are completely normal, you will receive your results only by: MyChart Message (if you have MyChart) OR A paper copy in the mail If you have any lab test that is abnormal or we need to change your treatment, we will call you to review the results.  Testing/Procedures:  CAROTID DUPLEX  Your physician has requested that you have a carotid duplex. This test is an ultrasound of the carotid arteries in your neck. It looks at blood flow through these arteries that supply the brain with blood. Allow one hour for this exam. There are no restrictions or special instructions.   Follow-Up: At [facility], you and your health needs are our priority.  As part of our continuing mission to provide you with exceptional heart care, our providers are all part of one team.  This team includes your primary Cardiologist (physician) and Advanced Practice Providers or APPs (Physician Assistants and Nurse Practitioners) who all work together to provide you with the care you need, when you need it.  Your next appointment:   Monday, January 11, 2025 @ 11:40  Provider:   Gordy Bergamo, MD

## 2024-12-14 ENCOUNTER — Ambulatory Visit (HOSPITAL_COMMUNITY)

## 2025-01-11 ENCOUNTER — Ambulatory Visit: Admitting: Cardiology
